# Patient Record
Sex: Female | Born: 1975 | Race: Black or African American | Hispanic: No | Marital: Single | Smoking: Never smoker
Health system: Southern US, Community
[De-identification: ages and names within clinical notes are randomized; demographics above are authoritative.]

## PROBLEM LIST (undated history)

## (undated) DIAGNOSIS — M1990 Unspecified osteoarthritis, unspecified site: Secondary | ICD-10-CM

## (undated) DIAGNOSIS — I10 Essential (primary) hypertension: Secondary | ICD-10-CM

## (undated) DIAGNOSIS — K219 Gastro-esophageal reflux disease without esophagitis: Secondary | ICD-10-CM

## (undated) DIAGNOSIS — R519 Headache, unspecified: Secondary | ICD-10-CM

## (undated) DIAGNOSIS — D649 Anemia, unspecified: Secondary | ICD-10-CM

## (undated) HISTORY — PX: KNEE ARTHROSCOPY W/ DEBRIDEMENT: SHX1867

## (undated) HISTORY — PX: TUBAL LIGATION: SHX77

---
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
## 2005-02-26 HISTORY — PX: HERNIA REPAIR: SHX51

## 2017-02-26 DIAGNOSIS — I10 Essential (primary) hypertension: Secondary | ICD-10-CM

## 2017-02-26 HISTORY — DX: Essential (primary) hypertension: I10

## 2018-05-12 NOTE — Progress Notes (Signed)
NEED ORDERS IN Epic FOR 4-1 SURGERY 

## 2018-05-28 ENCOUNTER — Encounter (HOSPITAL_COMMUNITY): Payer: Self-pay

## 2018-05-28 ENCOUNTER — Ambulatory Visit (HOSPITAL_COMMUNITY): Admit: 2018-05-28 | Payer: Self-pay | Admitting: Orthopedic Surgery

## 2018-05-28 SURGERY — ARTHROSCOPY, KNEE
Anesthesia: Choice | Laterality: Right

## 2018-10-16 NOTE — Patient Instructions (Addendum)
YOU NEED TO HAVE A COVID 19 TEST ON__Saturday 8/22_____ @_12 :10______, THIS TEST MUST BE DONE BEFORE SURGERY, COME  [address], [place] , [phone]. ONCE YOUR COVID TEST IS COMPLETED, PLEASE BEGIN THE QUARANTINE INSTRUCTIONS AS OUTLINED IN YOUR HANDOUT.                Nicole PenmanLatasha Charles   Your procedure is scheduled on: Wednesday 10/22/18   [facility]  Entrance Report to admitting at  5:30 AM   1 VISITOR IS ALLOWED TO WAIT IN WAITING ROOM  ONLY DAY OF YOUR SURGERY . NO VISITORS ARE ALLOWED IN SHORT STAY OR RECOVERY ROOM.   Call this number if you have problems the morning of surgery [phone]    . BRUSH YOUR TEETH MORNING OF SURGERY AND RINSE YOUR MOUTH OUT, NO CHEWING GUM CANDY OR MINTS.  Do not eat food After Midnight . YOU MAY HAVE CLEAR LIQUIDS FROM MIDNIGHT UNTIL 4:30AM.  At 4:30AM Please finish the prescribed Pre-Surgery  Drink.  Nothing by mouth after you finish the  drink !    Take these medicines the morning of surgery with A SIP OF WATER: none                                You may not have any metal on your body including hair pins and              piercings              Do not wear jewelry, make-up, lotions, powders or perfumes, deodorant             Do not wear nail polish.  Do not shave  48 hours prior to surgery.     Do not bring valuables to the hospital. [facility] IS NOT             RESPONSIBLE   FOR VALUABLES.  Contacts, dentures or bridgework may not be worn into surgery. .     Patients discharged the day of surgery will not be allowed to drive home.  IF YOU ARE HAVING SURGERY AND GOING HOME THE SAME DAY, YOU MUST HAVE AN ADULT TO DRIVE YOU HOME AND BE WITH YOU FOR 24 HOURS.  YOU MAY GO HOME BY TAXI OR UBER OR ORTHERWISE, BUT AN ADULT MUST ACCOMPANY YOU HOME AND STAY WITH YOU FOR 24 HOURS.  Name and phone number of your driver:  Special Instructions: N/A              Please read over the following fact sheets you  were given: _____________________________________________________________________             [facility] - Preparing for [facility] Before surgery, you can play an important role .  Because skin is not sterile, your skin needs to be as free of germs as possible .  You can reduce the number of germs on your skin by washing with CHG (chlorahexidine gluconate) soap before surgery.   CHG is an antiseptic cleaner which kills germs and bonds with the skin to continue killing germs even after washing. Please DO NOT use if you have an allergy to CHG or antibacterial soaps.   If your skin becomes reddened/irritated stop using the CHG and inform your nurse when you arrive at Short Stay. Do not shave (including legs and underarms) for at least 48 hours prior to the  first CHG shower.  . Please follow these instructions carefully:  1.  Shower with CHG Soap the night before surgery and the  morning of Surgery.  2.  If you choose to wash your hair, wash your hair first as usual with your  normal  shampoo.  3.  After you shampoo, rinse your hair and body thoroughly to remove the  shampoo.                                        4.  Use CHG as you would any other liquid soap.  You can apply chg directly  to the skin and wash                       Gently with a scrungie or clean washcloth.  5.  Apply the CHG Soap to your body ONLY FROM THE NECK DOWN.   Do not use on face/ open                           Wound or open sores. Avoid contact with eyes, ears mouth and genitals (private parts).                       Wash face,  Genitals (private parts) with your normal soap.             6.  Wash thoroughly, paying special attention to the area where your surgery  will be performed.  7.  Thoroughly rinse your body with warm water from the neck down.  8.  DO NOT shower/wash with your normal soap after using and rinsing off  the CHG Soap.             9.  Pat yourself dry with a clean towel.            10.  Wear clean  pajamas.            11.  Place clean sheets on your bed the night of your first shower and do not  sleep with pets. Day of Surgery : Do not apply any lotions/deodorants the morning of surgery.  Please wear clean clothes to the hospital/surgery center.  FAILURE TO FOLLOW THESE INSTRUCTIONS MAY RESULT IN THE CANCELLATION OF YOUR SURGERY PATIENT SIGNATURE_________________________________  NURSE SIGNATURE__________________________________  ________________________________________________________________________   Nicole Charles  An incentive spirometer is a tool that can help keep your lungs clear and active. This tool measures how well you are filling your lungs with each breath. Taking long deep breaths may help reverse or decrease the chance of developing breathing (pulmonary) problems (especially infection) following:  A long period of time when you are unable to move or be active. BEFORE THE PROCEDURE   If the spirometer includes an indicator to show your best effort, your nurse or respiratory therapist will set it to a desired goal.  If possible, sit up straight or lean slightly forward. Try not to slouch.  Hold the incentive spirometer in an upright position. INSTRUCTIONS FOR USE  1. Sit on the edge of your bed if possible, or sit up as far as you can in bed or on a chair. 2. Hold the incentive spirometer in an upright position. 3. Breathe out normally. 4. Place the mouthpiece in your mouth and seal your lips tightly around it.  5. Breathe in slowly and as deeply as possible, raising the piston or the ball toward the top of the column. 6. Hold your breath for 3-5 seconds or for as long as possible. Allow the piston or ball to fall to the bottom of the column. 7. Remove the mouthpiece from your mouth and breathe out normally. 8. Rest for a few seconds and repeat Steps 1 through 7 at least 10 times every 1-2 hours when you are awake. Take your time and take a few normal breaths  between deep breaths. 9. The spirometer may include an indicator to show your best effort. Use the indicator as a goal to work toward during each repetition. 10. After each set of 10 deep breaths, practice coughing to be sure your lungs are clear. If you have an incision (the cut made at the time of surgery), support your incision when coughing by placing a pillow or rolled up towels firmly against it. Once you are able to get out of bed, walk around indoors and cough well. You may stop using the incentive spirometer when instructed by your caregiver.  RISKS AND COMPLICATIONS  Take your time so you do not get dizzy or light-headed.  If you are in pain, you may need to take or ask for pain medication before doing incentive spirometry. It is harder to take a deep breath if you are having pain. AFTER USE  Rest and breathe slowly and easily.  It can be helpful to keep track of a log of your progress. Your caregiver can provide you with a simple table to help with this. If you are using the spirometer at home, follow these instructions: SEEK MEDICAL CARE IF:   You are having difficultly using the spirometer.  You have trouble using the spirometer as often as instructed.  Your pain medication is not giving enough relief while using the spirometer.  You develop fever of 100.5 F (38.1 C) or higher. SEEK IMMEDIATE MEDICAL CARE IF:   You cough up bloody sputum that had not been present before.  You develop fever of 102 F (38.9 C) or greater.  You develop worsening pain at or near the incision site. MAKE SURE YOU:   Understand these instructions.  Will watch your condition.  Will get help right away if you are not doing well or get worse. Document Released: 06/25/2006 Document Revised: 05/07/2011 Document Reviewed: 08/26/2006 [facility] Patient Information 2014 [employer] [place].   ________________________________________________________________________

## 2018-10-17 ENCOUNTER — Encounter (HOSPITAL_COMMUNITY)
Admission: RE | Admit: 2018-10-17 | Discharge: 2018-10-17 | Disposition: A | Discharge status: Home / Self Care | Payer: PRIVATE HEALTH INSURANCE | Source: Ambulatory Visit | Attending: Orthopedic Surgery | Admitting: Orthopedic Surgery

## 2018-10-17 ENCOUNTER — Encounter (HOSPITAL_COMMUNITY): Payer: Self-pay

## 2018-10-17 ENCOUNTER — Other Ambulatory Visit: Payer: Self-pay

## 2018-10-17 DIAGNOSIS — M949 Disorder of cartilage, unspecified: Secondary | ICD-10-CM | POA: Insufficient documentation

## 2018-10-17 DIAGNOSIS — Z01818 Encounter for other preprocedural examination: Secondary | ICD-10-CM | POA: Insufficient documentation

## 2018-10-17 DIAGNOSIS — K219 Gastro-esophageal reflux disease without esophagitis: Secondary | ICD-10-CM | POA: Diagnosis not present

## 2018-10-17 DIAGNOSIS — I10 Essential (primary) hypertension: Secondary | ICD-10-CM | POA: Diagnosis not present

## 2018-10-17 DIAGNOSIS — Z20828 Contact with and (suspected) exposure to other viral communicable diseases: Secondary | ICD-10-CM | POA: Insufficient documentation

## 2018-10-17 HISTORY — DX: Gastro-esophageal reflux disease without esophagitis: K21.9

## 2018-10-17 LAB — CBC
HCT: 39.7 % (ref 36.0–46.0)
Hemoglobin: 12.6 g/dL (ref 12.0–15.0)
MCH: 30.2 pg (ref 26.0–34.0)
MCHC: 31.7 g/dL (ref 30.0–36.0)
MCV: 95.2 fL (ref 80.0–100.0)
Platelets: 236 10*3/uL (ref 150–400)
RBC: 4.17 MIL/uL (ref 3.87–5.11)
RDW: 13.1 % (ref 11.5–15.5)
WBC: 4.1 10*3/uL (ref 4.0–10.5)
nRBC: 0 % (ref 0.0–0.2)

## 2018-10-17 LAB — BASIC METABOLIC PANEL
Anion gap: 8 (ref 5–15)
BUN: 13 mg/dL (ref 6–20)
CO2: 25 mmol/L (ref 22–32)
Calcium: 9.5 mg/dL (ref 8.9–10.3)
Chloride: 108 mmol/L (ref 98–111)
Creatinine, Ser: 0.79 mg/dL (ref 0.44–1.00)
GFR calc Af Amer: >60 mL/min (ref >60)
GFR calc non Af Amer: >60 mL/min (ref >60)
Glucose, Bld: 99 mg/dL (ref 70–99)
Potassium: 4 mmol/L (ref 3.5–5.1)
Sodium: 141 mmol/L (ref 135–145)

## 2018-10-17 NOTE — Progress Notes (Signed)
PCP - Elisabeth Pigeon NP in [place] ,[place] but no longer sees. Cardiologist - none  Chest x-ray - un known EKG - 10/17/18 Stress Test - none ECHO - none Cardiac Cath - none  Sleep Study - no CPAP -   Fasting Blood Sugar -NA Checks Blood Sugar _____ times a day  Blood Thinner Instructions:NA Aspirin Instructions: Last Dose:  Anesthesia review:   Patient denies shortness of breath, fever, cough and chest pain at PAT appointment Yes. Her BP was elevated. Taken twice, bi lat  Arms. PA notified  Patient verbalized understanding of instructions that were given to them at the PAT appointment. Patient was also instructed that they will need to review over the PAT instructions again at home before surgery. Yes

## 2018-10-18 ENCOUNTER — Other Ambulatory Visit (HOSPITAL_COMMUNITY)
Admission: RE | Admit: 2018-10-18 | Discharge: 2018-10-18 | Disposition: A | Discharge status: Home / Self Care | Payer: PRIVATE HEALTH INSURANCE | Source: Ambulatory Visit | Attending: Orthopedic Surgery | Admitting: Orthopedic Surgery

## 2018-10-18 DIAGNOSIS — Z01818 Encounter for other preprocedural examination: Secondary | ICD-10-CM | POA: Diagnosis not present

## 2018-10-18 LAB — SARS CORONAVIRUS 2 (TAT 6-24 HRS): SARS Coronavirus 2: NEGATIVE

## 2018-10-21 NOTE — Anesthesia Preprocedure Evaluation (Addendum)
Anesthesia Evaluation  Patient identified by MRN, date of birth, ID band Patient awake    Reviewed: Allergy & Precautions, NPO status , Patient's Chart, lab work & pertinent test results  History of Anesthesia Complications Negative for: history of anesthetic complications  Airway Mallampati: II  TM Distance: >3 FB Neck ROM: Full    Dental  (+) Dental Advisory Given, Teeth Intact   Pulmonary neg pulmonary ROS,    Pulmonary exam normal        Cardiovascular hypertension, Pt. on medications Normal cardiovascular exam     Neuro/Psych negative neurological ROS  negative psych ROS   GI/Hepatic Neg liver ROS, GERD  Controlled,  Endo/Other  negative endocrine ROS  Renal/GU negative Renal ROS     Musculoskeletal negative musculoskeletal ROS (+)   Abdominal   Peds  Hematology negative hematology ROS (+)   Anesthesia Other Findings   Reproductive/Obstetrics  S/p tubal ligation                             Anesthesia Physical Anesthesia Plan  ASA: III  Anesthesia Plan: General   Post-op Pain Management:    Induction: Intravenous  PONV Risk Score and Plan: 3 and Treatment may vary due to age or medical condition, Ondansetron, Dexamethasone and Midazolam  Airway Management Planned: LMA  Additional Equipment: None  Intra-op Plan:   Post-operative Plan: Extubation in OR  Informed Consent: I have reviewed the patients History and Physical, chart, labs and discussed the procedure including the risks, benefits and alternatives for the proposed anesthesia with the patient or authorized representative who has indicated his/her understanding and acceptance.     Dental advisory given  Plan Discussed with: CRNA and Anesthesiologist  Anesthesia Plan Comments:        Anesthesia Quick Evaluation

## 2018-10-21 NOTE — Progress Notes (Signed)
Anesthesia Chart Review   Case: [id number] Date/Time: 10/22/18 0715   Procedure: Right knee arthroscopy; chondroplasty (Right ) - 31min   Anesthesia type: Choice   Pre-op diagnosis: right knee chondral defect   Location: [facility] ROOM 10 / [facility]   Surgeon: Gaynelle Arabian, MD      DISCUSSION:42 y.o. never smoker with h/o HTN, GERD, right knee chondral defect scheduled for above procedure 10/22/2018 with Dr. Gaynelle Arabian.    Pt reports she is currently taking no medications for blood pressure.  Elevated at PAT, 179/107.  She is asymptomatic.  Advised to contact PCP for better BP control before proceeding with surgery.    VS: BP (!) 179/107   Pulse 67   Temp 37 C (Oral)   Ht 5\' 3"  (1.6 m)   Wt 66.2 kg   LMP 10/07/2018 (Approximate)   SpO2 100%   BMI 25.86 kg/m   PROVIDERS: Patient, No Pcp Per   LABS: Labs reviewed: Acceptable for surgery. (all labs ordered are listed, but only abnormal results are displayed)  Labs Reviewed  CBC  BASIC METABOLIC PANEL     IMAGES:   EKG: 10/17/2018 Rate 75 bpm Normal sinus rhythm   CV:  Past Medical History:  Diagnosis Date  . GERD (gastroesophageal reflux disease)   . Hypertension 2019   not on meds     Past Surgical History:  Procedure Laterality Date  . HERNIA REPAIR  2703   umbilical  . TUBAL LIGATION      MEDICATIONS: . calcium carbonate (TUMS - DOSED IN MG ELEMENTAL CALCIUM) 500 MG chewable tablet  . diphenhydramine-acetaminophen (TYLENOL PM) 25-500 MG TABS tablet  . medroxyPROGESTERone (DEPO-PROVERA) 150 MG/ML injection   No current facility-administered medications for this encounter.      Maia Plan [facility] Pre-Surgical Testing [phone] 10/21/18  10:44 AM

## 2018-10-22 ENCOUNTER — Ambulatory Visit (HOSPITAL_COMMUNITY): Payer: PRIVATE HEALTH INSURANCE | Admitting: Physician Assistant

## 2018-10-22 ENCOUNTER — Ambulatory Visit (HOSPITAL_COMMUNITY)
Admission: RE | Admit: 2018-10-22 | Discharge: 2018-10-22 | Disposition: A | Discharge status: Home / Self Care | Payer: PRIVATE HEALTH INSURANCE | Attending: Orthopedic Surgery | Admitting: Orthopedic Surgery

## 2018-10-22 ENCOUNTER — Encounter (HOSPITAL_COMMUNITY)
Admission: RE | Disposition: A | Discharge status: Home / Self Care | Payer: Self-pay | Source: Home / Self Care | Attending: Orthopedic Surgery

## 2018-10-22 ENCOUNTER — Other Ambulatory Visit: Payer: Self-pay

## 2018-10-22 ENCOUNTER — Ambulatory Visit (HOSPITAL_COMMUNITY): Payer: PRIVATE HEALTH INSURANCE | Admitting: Anesthesiology

## 2018-10-22 ENCOUNTER — Encounter (HOSPITAL_COMMUNITY): Payer: Self-pay

## 2018-10-22 DIAGNOSIS — I10 Essential (primary) hypertension: Secondary | ICD-10-CM | POA: Diagnosis not present

## 2018-10-22 DIAGNOSIS — M948X6 Other specified disorders of cartilage, lower leg: Secondary | ICD-10-CM | POA: Insufficient documentation

## 2018-10-22 DIAGNOSIS — Z79899 Other long term (current) drug therapy: Secondary | ICD-10-CM | POA: Diagnosis not present

## 2018-10-22 DIAGNOSIS — Z793 Long term (current) use of hormonal contraceptives: Secondary | ICD-10-CM | POA: Insufficient documentation

## 2018-10-22 DIAGNOSIS — M23241 Derangement of anterior horn of lateral meniscus due to old tear or injury, right knee: Secondary | ICD-10-CM | POA: Diagnosis not present

## 2018-10-22 DIAGNOSIS — M25561 Pain in right knee: Secondary | ICD-10-CM | POA: Diagnosis present

## 2018-10-22 DIAGNOSIS — K219 Gastro-esophageal reflux disease without esophagitis: Secondary | ICD-10-CM | POA: Insufficient documentation

## 2018-10-22 HISTORY — PX: KNEE ARTHROSCOPY: SHX127

## 2018-10-22 LAB — PREGNANCY, URINE: Preg Test, Ur: NEGATIVE

## 2018-10-22 SURGERY — ARTHROSCOPY, KNEE
Anesthesia: General | Site: Knee | Laterality: Right

## 2018-10-22 MED ORDER — DEXAMETHASONE SODIUM PHOSPHATE 10 MG/ML IJ SOLN
INTRAMUSCULAR | Status: DC | PRN
  Administered 2018-10-22: 10 mg via INTRAVENOUS

## 2018-10-22 MED ORDER — DEXAMETHASONE SODIUM PHOSPHATE 10 MG/ML IJ SOLN
INTRAMUSCULAR | Status: AC
  Filled 2018-10-22: qty 1

## 2018-10-22 MED ORDER — 0.9 % SODIUM CHLORIDE (POUR BTL) OPTIME
TOPICAL | Status: DC | PRN
  Administered 2018-10-22: 08:00:00 1000 mL

## 2018-10-22 MED ORDER — ONDANSETRON HCL 4 MG/2ML IJ SOLN
INTRAMUSCULAR | Status: DC | PRN
  Administered 2018-10-22: 4 mg via INTRAVENOUS

## 2018-10-22 MED ORDER — LACTATED RINGERS IV SOLN
INTRAVENOUS | Status: DC
  Administered 2018-10-22: 07:00:00 via INTRAVENOUS

## 2018-10-22 MED ORDER — LIDOCAINE 2% (20 MG/ML) 5 ML SYRINGE
INTRAMUSCULAR | Status: DC | PRN
  Administered 2018-10-22: 80 mg via INTRAVENOUS

## 2018-10-22 MED ORDER — PROMETHAZINE HCL 25 MG/ML IJ SOLN: 6.2500 mg | INTRAMUSCULAR | Status: DC | PRN

## 2018-10-22 MED ORDER — BUPIVACAINE-EPINEPHRINE 0.25% -1:200000 IJ SOLN
INTRAMUSCULAR | Status: DC | PRN
  Administered 2018-10-22: 20 mL

## 2018-10-22 MED ORDER — METHOCARBAMOL 500 MG PO TABS: 500.0000 mg | ORAL_TABLET | Freq: Three times a day (TID) | ORAL | 1 refills | Status: DC | PRN

## 2018-10-22 MED ORDER — ONDANSETRON HCL 4 MG/2ML IJ SOLN
INTRAMUSCULAR | Status: AC
  Filled 2018-10-22: qty 2

## 2018-10-22 MED ORDER — OXYCODONE HCL 5 MG PO TABS: 5.0000 mg | ORAL_TABLET | Freq: Once | ORAL | Status: DC | PRN

## 2018-10-22 MED ORDER — LIDOCAINE 2% (20 MG/ML) 5 ML SYRINGE
INTRAMUSCULAR | Status: AC
  Filled 2018-10-22: qty 5

## 2018-10-22 MED ORDER — OXYCODONE HCL 5 MG/5ML PO SOLN: 5.0000 mg | Freq: Once | ORAL | Status: DC | PRN

## 2018-10-22 MED ORDER — MIDAZOLAM HCL 2 MG/2ML IJ SOLN
INTRAMUSCULAR | Status: AC
  Filled 2018-10-22: qty 2

## 2018-10-22 MED ORDER — HYDROCODONE-ACETAMINOPHEN 5-325 MG PO TABS: 1.0000 | ORAL_TABLET | Freq: Four times a day (QID) | ORAL | 0 refills | Status: DC | PRN

## 2018-10-22 MED ORDER — FENTANYL CITRATE (PF) 100 MCG/2ML IJ SOLN
25.0000 ug | INTRAMUSCULAR | Status: DC | PRN
  Administered 2018-10-22 (×2): 50 ug via INTRAVENOUS

## 2018-10-22 MED ORDER — FENTANYL CITRATE (PF) 100 MCG/2ML IJ SOLN
INTRAMUSCULAR | Status: AC
  Administered 2018-10-22: 50 ug via INTRAVENOUS
  Filled 2018-10-22: qty 2

## 2018-10-22 MED ORDER — POVIDONE-IODINE 10 % EX SWAB
2.0000 "application " | Freq: Once | CUTANEOUS | Status: AC
  Administered 2018-10-22: 2 via TOPICAL

## 2018-10-22 MED ORDER — CHLORHEXIDINE GLUCONATE 4 % EX LIQD: 60.0000 mL | Freq: Once | CUTANEOUS | Status: DC

## 2018-10-22 MED ORDER — PROPOFOL 10 MG/ML IV BOLUS
INTRAVENOUS | Status: DC | PRN
  Administered 2018-10-22: 200 mg via INTRAVENOUS

## 2018-10-22 MED ORDER — LACTATED RINGERS IR SOLN
Status: DC | PRN
  Administered 2018-10-22: 9000 mL

## 2018-10-22 MED ORDER — FENTANYL CITRATE (PF) 100 MCG/2ML IJ SOLN
INTRAMUSCULAR | Status: AC
  Filled 2018-10-22: qty 2

## 2018-10-22 MED ORDER — KETOROLAC TROMETHAMINE 30 MG/ML IJ SOLN
INTRAMUSCULAR | Status: DC | PRN
  Administered 2018-10-22: 30 mg via INTRAVENOUS

## 2018-10-22 MED ORDER — PROPOFOL 10 MG/ML IV BOLUS
INTRAVENOUS | Status: AC
  Filled 2018-10-22: qty 20

## 2018-10-22 MED ORDER — MIDAZOLAM HCL 5 MG/5ML IJ SOLN
INTRAMUSCULAR | Status: DC | PRN
  Administered 2018-10-22: 2 mg via INTRAVENOUS

## 2018-10-22 MED ORDER — KETOROLAC TROMETHAMINE 30 MG/ML IJ SOLN
INTRAMUSCULAR | Status: AC
  Filled 2018-10-22: qty 1

## 2018-10-22 MED ORDER — CEFAZOLIN SODIUM-DEXTROSE 2-4 GM/100ML-% IV SOLN
2.0000 g | INTRAVENOUS | Status: AC
  Administered 2018-10-22: 07:00:00 2 g via INTRAVENOUS
  Filled 2018-10-22: qty 100

## 2018-10-22 MED ORDER — FENTANYL CITRATE (PF) 100 MCG/2ML IJ SOLN
INTRAMUSCULAR | Status: DC | PRN
  Administered 2018-10-22 (×4): 25 ug via INTRAVENOUS

## 2018-10-22 MED ORDER — BUPIVACAINE-EPINEPHRINE (PF) 0.25% -1:200000 IJ SOLN
INTRAMUSCULAR | Status: AC
  Filled 2018-10-22: qty 30

## 2018-10-22 SURGICAL SUPPLY — 34 items
BLADE SURG 15 STRL LF DISP TIS (BLADE) ×1 IMPLANT
BLADE SURG 15 STRL SS (BLADE) ×1
BNDG ELASTIC 6X5.8 VLCR STR LF (GAUZE/BANDAGES/DRESSINGS) ×2 IMPLANT
COVER WAND RF STERILE (DRAPES) IMPLANT
DECANTER SPIKE VIAL GLASS SM (MISCELLANEOUS) ×2 IMPLANT
DISSECTOR 4.0MM X 13CM (MISCELLANEOUS) ×2 IMPLANT
DRAPE ARTHROSCOPY W/POUCH 114 (DRAPES) ×2 IMPLANT
DRAPE SHEET LG 3/4 BI-LAMINATE (DRAPES) ×2 IMPLANT
DRAPE U-SHAPE 47X51 STRL (DRAPES) ×2 IMPLANT
DRSG EMULSION OIL 3X3 NADH (GAUZE/BANDAGES/DRESSINGS) ×2 IMPLANT
DRSG PAD ABDOMINAL 8X10 ST (GAUZE/BANDAGES/DRESSINGS) ×2 IMPLANT
DURAPREP 26ML APPLICATOR (WOUND CARE) ×2 IMPLANT
GAUZE SPONGE 4X4 12PLY STRL (GAUZE/BANDAGES/DRESSINGS) ×2 IMPLANT
GLOVE BIO SURGEON STRL SZ8 (GLOVE) ×2 IMPLANT
GLOVE BIOGEL PI IND STRL 7.0 (GLOVE) ×1 IMPLANT
GLOVE BIOGEL PI IND STRL 8 (GLOVE) ×1 IMPLANT
GLOVE BIOGEL PI INDICATOR 7.0 (GLOVE) ×1
GLOVE BIOGEL PI INDICATOR 8 (GLOVE) ×1
GLOVE SURG SS PI 7.0 STRL IVOR (GLOVE) ×2 IMPLANT
GOWN STRL REUS W/TWL LRG LVL3 (GOWN DISPOSABLE) ×4 IMPLANT
KIT BASIN OR (CUSTOM PROCEDURE TRAY) ×2 IMPLANT
KIT TURNOVER KIT A (KITS) IMPLANT
MANIFOLD NEPTUNE II (INSTRUMENTS) ×2 IMPLANT
MARKER SKIN DUAL TIP RULER LAB (MISCELLANEOUS) ×2 IMPLANT
PACK ARTHROSCOPY WL (CUSTOM PROCEDURE TRAY) ×2 IMPLANT
PACK ICE MAXI GEL EZY WRAP (MISCELLANEOUS) ×6 IMPLANT
PADDING CAST COTTON 6X4 STRL (CAST SUPPLIES) ×2 IMPLANT
PORT APPOLLO RF 90DEGREE MULTI (SURGICAL WAND) ×2 IMPLANT
PROTECTOR NERVE ULNAR (MISCELLANEOUS) ×2 IMPLANT
SUT ETHILON 4 0 PS 2 18 (SUTURE) ×4 IMPLANT
TOWEL OR 17X26 10 PK STRL BLUE (TOWEL DISPOSABLE) ×2 IMPLANT
TUBING ARTHRO INFLOW-ONLY STRL (TUBING) ×2 IMPLANT
TUBING CONNECTING 10 (TUBING) ×4 IMPLANT
WRAP KNEE MAXI GEL POST OP (GAUZE/BANDAGES/DRESSINGS) ×2 IMPLANT

## 2018-10-22 NOTE — Transfer of Care (Signed)
Immediate Anesthesia Transfer of Care Note  Patient: Felice Hope  Procedure(s) Performed: Right knee arthroscopy; chondroplasty, Meniscal Debridemnt (Right Knee)  Patient Location: PACU  Anesthesia Type:General  Level of Consciousness: drowsy  Airway & Oxygen Therapy: Patient Spontanous Breathing and Patient connected to face mask oxygen  Post-op Assessment: Report given to RN and Post -op Vital signs reviewed and stable  Post vital signs: Reviewed and stable  Last Vitals:  Vitals Value Taken Time  BP 132/64 10/22/18 0827  Temp 36.7 C 10/22/18 0827  Pulse 73 10/22/18 0828  Resp 16 10/22/18 0828  SpO2 100 % 10/22/18 0828  Vitals shown include unvalidated device data.  Last Pain:  Vitals:   10/22/18 0543  TempSrc: Oral  PainSc:       Patients Stated Pain Goal: 3 (37/04/88 8916)  Complications: No apparent anesthesia complications

## 2018-10-22 NOTE — Anesthesia Postprocedure Evaluation (Signed)
Anesthesia Post Note  Patient: Nicole Charles  Procedure(s) Performed: Right knee arthroscopy; chondroplasty, Meniscal Debridemnt (Right Knee)     Patient location during evaluation: PACU Anesthesia Type: General Level of consciousness: awake and alert Pain management: pain level controlled Vital Signs Assessment: post-procedure vital signs reviewed and stable Respiratory status: spontaneous breathing, nonlabored ventilation and respiratory function stable Cardiovascular status: blood pressure returned to baseline and stable Postop Assessment: no apparent nausea or vomiting Anesthetic complications: no    Last Vitals:  Vitals:   10/22/18 0851 10/22/18 0900  BP:  129/84  Pulse:  70  Resp:  14  Temp:    SpO2: 97% 95%                  Audry Pili

## 2018-10-22 NOTE — Op Note (Signed)
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 Bowman Higbie, MD  Anesthesia-General  EBL-  Minimal  Complications- None  Condition- PACU - hemodynamically stable.  Brief clinical note- -Nicole Charles is a 43 y.o.  female with a several year history of Right pain and mechanical symptoms. Exam and history suggested patellar chondral defect The patient presents now for arthroscopy and debridement  Procedure in detail -       After successful administration of General anesthetic,  the Right lower extremity is prepped and draped in the usual sterile fashion. Time out is performed by the surgical team. Standard superomedial and inferolateral portal sites are marked and incisions made with an 11 blade. The inflow cannula is passed through the superomedial portal and camera through the inferolateral portal and inflow is initiated. Arthroscopic visualization proceeds.      The undersurface of the patella and trochlea are visualized and there is a chondral defect inferior pole patella as well as hypertrophic scarring of the fat pad impinging on the patellofemoral joint. The medial and lateral gutters are visualized and there are no loose bodies. Flexion and valgus force is applied to the knee and the medial compartment is entered. A spinal needle is passed into the joint through the site marked for the inferomedial portal. A small incision is made and the dilator passed into the joint. The findings for the medial compartment are normal .   The intercondylar notch is visualized and the ACL appears normal . The lateral compartment is entered and the findings are tear of body and anterior horn lateral meniscus . The tear is debrided to a stable base with  baskets and a shaver and sealed off with the Arthrocare. The patellofemoral joint is then addressed. The shaver is used to debride the unstable cartilage to a stable cartilaginous base with stable edges. It is probed and found to be stable. The hypertrophic portion of the fat pad is debrided back to normal tissue to eliminate the impingement.     The joint is again inspected and there are no other tears, defects or loose bodies identified. The arthroscopic equipment is then removed from the inferior portals which are closed with interrupted 4-0 nylon. 20 ml of .25% Marcaine with epinephrine are injected through the inflow cannula and the cannula is then removed and the portal closed with nylon. The incisions are cleaned and dried and a bulky sterile dressing is applied. The patient is then awakened and transported to recovery in stable condition.   10/22/2018, 8:18 AM

## 2018-10-22 NOTE — H&P (Signed)
CC- Nicole Charles is a 43 y.o. female who presents with right knee pain.  HPI- . Knee Pain: Patient presents with knee pain involving the  right knee. Onset of the symptoms was several years ago. Inciting event: none known. Current symptoms include crepitus sensation, giving out, pain located anteriorly, stiffness and swelling. Pain is aggravated by going up and down stairs, pivoting, rising after sitting, standing and walking.  Patient has had prior knee problems. Evaluation to date: previous arthroscopy with chondral defects patella. Treatment to date: corticosteroid injection which was not very effective and viscosupplement injections which were not very effective.  Past Medical History:  Diagnosis Date  . GERD (gastroesophageal reflux disease)   . Hypertension 2019   not on meds     Past Surgical History:  Procedure Laterality Date  . HERNIA REPAIR  3235   umbilical  . TUBAL LIGATION      Prior to Admission medications   Medication Sig Start Date End Date Taking? Authorizing Provider  calcium carbonate (TUMS - DOSED IN MG ELEMENTAL CALCIUM) 500 MG chewable tablet Chew 1 tablet by mouth daily as needed for indigestion or heartburn.   Yes [provider]  diphenhydramine-acetaminophen (TYLENOL PM) 25-500 MG TABS tablet Take 1 tablet by mouth at bedtime as needed (sleep).   Yes [provider]  lisinopril (ZESTRIL) 5 MG tablet Take 5 mg by mouth daily. Just started taking this week   Yes [provider]  medroxyPROGESTERone (DEPO-PROVERA) 150 MG/ML injection Inject 150 mg into the muscle every 3 (three) months.   Yes [provider]   KNEE EXAM antalgic gait, effusion, negative drawer sign, collateral ligaments intact, negative Lachman sign, significant crepitus on range of motion  Physical Examination: General appearance - alert, well appearing, and in no distress Mental status - alert, oriented to person, place, and time Chest - clear to  auscultation, no wheezes, rales or rhonchi, symmetric air entry Heart - normal rate, regular rhythm, normal S1, S2, no murmurs, rubs, clicks or gallops Abdomen - soft, nontender, nondistended, no masses or organomegaly Neurological - alert, oriented, normal speech, no focal findings or movement disorder noted    Asessment/Plan--- Right knee chondral defect- - Plan right knee arthroscopy with chondroplasty. Procedure risks and potential comps discussed with patient who elects to proceed. Goals are decreased pain and increased function with a high likelihood of achieving both

## 2018-10-22 NOTE — Discharge Instructions (Signed)
Dr. Gaynelle Arabian Total Joint Specialist [facility] [address], [place], [place] [address] [phone]   Arthroscopic Procedure, Knee An arthroscopic procedure can find what is wrong with your knee. PROCEDURE Arthroscopy is a surgical technique that allows your orthopedic surgeon to diagnose and treat your knee injury with accuracy. They will look into your knee through a small instrument. This is almost like a small (pencil sized) telescope. Because arthroscopy affects your knee less than open knee surgery, you can anticipate a more rapid recovery. Taking an active role by following your caregiver's instructions will help with rapid and complete recovery. Use crutches, rest, elevation, ice, and knee exercises as instructed. The length of recovery depends on various factors including type of injury, age, physical condition, medical conditions, and your rehabilitation. Your knee is the joint between the large bones (femur and tibia) in your leg. Cartilage covers these bone ends which are smooth and slippery and allow your knee to bend and move smoothly. Two menisci, thick, semi-lunar shaped pads of cartilage which form a rim inside the joint, help absorb shock and stabilize your knee. Ligaments bind the bones together and support your knee joint. Muscles move the joint, help support your knee, and take stress off the joint itself. Because of this all programs and physical therapy to rehabilitate an injured or repaired knee require rebuilding and strengthening your muscles. AFTER THE PROCEDURE  After the procedure, you will be moved to a recovery area until most of the effects of the medication have worn off. Your caregiver will discuss the test results with you.   Only take over-the-counter or prescription medicines for pain, discomfort, or fever as directed by your caregiver.  SEEK MEDICAL CARE IF:   You have increased bleeding from your wounds.   You see redness,  swelling, or have increasing pain in your wounds.   You have pus coming from your wound.   You have an oral temperature above 102 F (38.9 C).   You notice a bad smell coming from the wound or dressing.   You have severe pain with any motion of your knee.  SEEK IMMEDIATE MEDICAL CARE IF:   You develop a rash.   You have difficulty breathing.   You have any allergic problems.  FURTHER INSTRUCTIONS:   ICE to the affected knee every three hours for 30 minutes at a time and then as needed for pain and swelling.  Continue to use ice on the knee for pain and swelling from surgery. You may notice swelling that will progress down to the foot and ankle.  This is normal after surgery.  Elevate the leg when you are not up walking on it.    DIET You may resume your previous home diet once your are discharged from the hospital.  DRESSING / WOUND CARE / SHOWERING You may start showering two days after being discharged home but do not submerge the incisions under water.  Change dressing 48 hours after the procedure and then cover the small incisions with band aids until your follow up visit. Change the surgical dressings daily and reapply a dry dressing each time.   ACTIVITY Walk with your walker as instructed. Use walker as long as suggested by your caregivers. Avoid periods of inactivity such as sitting longer than an hour when not asleep. This helps prevent blood clots.  You may resume a sexual relationship in one month or when given the OK by your doctor.  You may return to  work once you are cleared by your doctor.  Do not drive a car for 6 weeks or until released by you surgeon.  Do not drive while taking narcotics.  WEIGHT BEARING Weight bearing as tolerated with crutches or walker until you are comfortable without them  POSTOPERATIVE CONSTIPATION PROTOCOL Constipation - defined medically as fewer than three stools per week and severe constipation as less than one stool per  week.  One of the most common issues patients have following surgery is constipation.  Even if you have a regular bowel pattern at home, your normal regimen is likely to be disrupted due to multiple reasons following surgery.  Combination of anesthesia, postoperative narcotics, change in appetite and fluid intake all can affect your bowels.  In order to avoid complications following surgery, here are some recommendations in order to help you during your recovery period.  Colace (docusate) - Pick up an over-the-counter form of Colace or another stool softener and take twice a day as long as you are requiring postoperative pain medications.  Take with a full glass of water daily.  If you experience loose stools or diarrhea, hold the colace until you stool forms back up.  If your symptoms do not get better within 1 week or if they get worse, check with your doctor.  Dulcolax (bisacodyl) - Pick up over-the-counter and take as directed by the product packaging as needed to assist with the movement of your bowels.  Take with a full glass of water.  Use this product as needed if not relieved by Colace only.   MiraLax (polyethylene glycol) - Pick up over-the-counter to have on hand.  MiraLax is a solution that will increase the amount of water in your bowels to assist with bowel movements.  Take as directed and can mix with a glass of water, juice, soda, coffee, or tea.  Take if you go more than two days without a movement. Do not use MiraLax more than once per day. Call your doctor if you are still constipated or irregular after using this medication for 7 days in a row.  If you continue to have problems with postoperative constipation, please contact the office for further assistance and recommendations.  If you experience "the worst abdominal pain ever" or develop nausea or vomiting, please contact the office immediatly for further recommendations for treatment.  ITCHING  If you experience itching with your  medications, try taking only a single pain pill, or even half a pain pill at a time.  You can also use Benadryl over the counter for itching or also to help with sleep.   TED HOSE STOCKINGS Wear the elastic stockings on both legs for three weeks following surgery during the day but you may remove then at night for sleeping.  MEDICATIONS See your medication summary on the After Visit Summary that the nursing staff will review with you prior to discharge.  You may have some home medications which will be placed on hold until you complete the course of blood thinner medication.  It is important for you to complete the blood thinner medication as prescribed by your surgeon.  Continue your approved medications as instructed at time of discharge. Do not drive while taking narcotics.   PRECAUTIONS If you experience chest pain or shortness of breath - call 911 immediately for transfer to the hospital emergency department.  If you develop a fever greater that 101 F, purulent drainage from wound, increased redness or drainage from wound, foul odor from  the wound/dressing, or calf pain - CONTACT YOUR SURGEON.                                                   FOLLOW-UP APPOINTMENTS Make sure you keep all of your appointments after your operation with your surgeon and caregivers. You should call the office at [phone]  and make an appointment for approximately one week after the date of your surgery or on the date instructed by your surgeon outlined in the "After Visit Summary".  RANGE OF MOTION AND STRENGTHENING EXERCISES  Rehabilitation of the knee is important following a knee injury or an operation. After just a few days of immobilization, the muscles of the thigh which control the knee become weakened and shrink (atrophy). Knee exercises are designed to build up the tone and strength of the thigh muscles and to improve knee motion. Often times heat used for twenty to thirty minutes before working  out will loosen up your tissues and help with improving the range of motion but do not use heat for the first two weeks following surgery. These exercises can be done on a training (exercise) mat, on the floor, on a table or on a bed. Use what ever works the best and is most comfortable for you Knee exercises include:  Take an 81 mg Aspirin daily for the next 4 weeks starting tomorrow  You may remove your large bandage and shower in 2 days  QUAD STRENGTHENING EXERCISES Strengthening Quadriceps Sets  Tighten muscles on top of thigh by pushing knees down into floor or table. Hold for 20 seconds. Repeat 10 times. Do 2 sessions per day.     Strengthening Terminal Knee Extension  With knee bent over bolster, straighten knee by tightening muscle on top of thigh. Be sure to keep bottom of knee on bolster. Hold for 20 seconds. Repeat 10 times. Do 2 sessions per day.   Straight Leg with Bent Knee  Lie on back with opposite leg bent. Keep involved knee slightly bent at knee and raise leg 4-6". Hold for 10 seconds. Repeat 20 times per set. Do 2 sets per session. Do 2 sessions per day.

## 2018-10-22 NOTE — Anesthesia Procedure Notes (Signed)
Procedure Name: LMA Insertion Date/Time: 10/22/2018 7:13 AM Performed by: Audry Pili, MD Patient Re-evaluated:Patient Re-evaluated prior to induction Oxygen Delivery Method: Circle system utilized Preoxygenation: Pre-oxygenation with 100% oxygen Induction Type: IV induction Ventilation: Mask ventilation without difficulty LMA: LMA inserted LMA Size: 4.0 Number of attempts: 1 Placement Confirmation: positive ETCO2 and breath sounds checked- equal and bilateral Dental Injury: Teeth and Oropharynx as per pre-operative assessment

## 2018-10-23 ENCOUNTER — Encounter (HOSPITAL_COMMUNITY): Payer: Self-pay | Admitting: Orthopedic Surgery

## 2020-05-19 ENCOUNTER — Other Ambulatory Visit: Payer: Self-pay | Admitting: Gastroenterology

## 2020-05-19 ENCOUNTER — Ambulatory Visit
Admission: RE | Admit: 2020-05-19 | Discharge: 2020-05-19 | Disposition: A | Discharge status: Home / Self Care | Payer: PRIVATE HEALTH INSURANCE | Source: Ambulatory Visit | Attending: Gastroenterology | Admitting: Gastroenterology

## 2020-05-19 DIAGNOSIS — R109 Unspecified abdominal pain: Secondary | ICD-10-CM

## 2020-05-24 ENCOUNTER — Other Ambulatory Visit: Payer: Self-pay | Admitting: Gastroenterology

## 2020-05-24 DIAGNOSIS — R9389 Abnormal findings on diagnostic imaging of other specified body structures: Secondary | ICD-10-CM

## 2020-05-24 DIAGNOSIS — N201 Calculus of ureter: Secondary | ICD-10-CM

## 2020-05-25 ENCOUNTER — Ambulatory Visit
Admission: RE | Admit: 2020-05-25 | Discharge: 2020-05-25 | Disposition: A | Discharge status: Home / Self Care | Payer: 59 | Source: Ambulatory Visit | Attending: Gastroenterology | Admitting: Gastroenterology

## 2020-05-25 DIAGNOSIS — N201 Calculus of ureter: Secondary | ICD-10-CM

## 2020-05-25 DIAGNOSIS — R9389 Abnormal findings on diagnostic imaging of other specified body structures: Secondary | ICD-10-CM

## 2020-07-14 ENCOUNTER — Other Ambulatory Visit (HOSPITAL_COMMUNITY): Payer: Self-pay | Admitting: Internal Medicine

## 2020-07-28 ENCOUNTER — Other Ambulatory Visit (HOSPITAL_COMMUNITY): Payer: Self-pay | Admitting: Internal Medicine

## 2020-07-28 DIAGNOSIS — R0789 Other chest pain: Secondary | ICD-10-CM

## 2020-08-17 ENCOUNTER — Telehealth (HOSPITAL_COMMUNITY): Payer: Self-pay | Admitting: *Deleted

## 2020-08-17 NOTE — Telephone Encounter (Signed)
Reaching out to patient to offer assistance regarding upcoming cardiac imaging study; pt verbalizes understanding of appt date/time, parking situation and where to check in, pre-test NPO status, and verified current allergies; name and call back number provided for further questions should they arise  Kendallyn Lippold RN Navigator Cardiac Imaging [facility] [phone] office [phone] cell  

## 2020-08-18 ENCOUNTER — Telehealth (HOSPITAL_COMMUNITY): Payer: Self-pay | Admitting: *Deleted

## 2020-08-18 ENCOUNTER — Encounter (HOSPITAL_COMMUNITY): Payer: Self-pay

## 2020-08-18 ENCOUNTER — Ambulatory Visit (HOSPITAL_COMMUNITY)
Admission: RE | Admit: 2020-08-18 | Discharge: 2020-08-18 | Disposition: A | Discharge status: Home / Self Care | Payer: 59 | Source: Ambulatory Visit | Attending: Internal Medicine | Admitting: Internal Medicine

## 2020-08-18 NOTE — Telephone Encounter (Signed)
Patient's CCTA has now been approved. Called patient to attempt to re-schedule CCTA. Left message on voicemail with name and callback number  Larey Brick RN Navigator Cardiac Imaging [facility] [phone] Office [phone] Cell

## 2020-08-31 ENCOUNTER — Telehealth (HOSPITAL_COMMUNITY): Payer: Self-pay | Admitting: *Deleted

## 2020-08-31 ENCOUNTER — Telehealth (HOSPITAL_COMMUNITY): Payer: Self-pay | Admitting: Emergency Medicine

## 2020-08-31 NOTE — Telephone Encounter (Signed)
Pt returning phone call regarding upcoming cardiac imaging study; pt verbalizes understanding of appt date/time, parking situation and where to check in, pre-test NPO status and medications ordered, and verified current allergies; name and call back number provided for further questions should they arise Nicole Alexandria RN Navigator Cardiac Imaging [facility] [phone] office [phone] cell  Daily meds (IV beta blockers if needed) Denies iv issues Denies claustro

## 2020-08-31 NOTE — Telephone Encounter (Signed)
Attempted to call patient regarding upcoming cardiac CT appointment. °Left message on voicemail with name and callback number ° °Kandy Towery RN Navigator Cardiac Imaging °[facility] °[phone] Office °[phone] Cell ° °

## 2020-09-01 ENCOUNTER — Other Ambulatory Visit: Payer: Self-pay

## 2020-09-01 ENCOUNTER — Ambulatory Visit (HOSPITAL_COMMUNITY)
Admission: RE | Admit: 2020-09-01 | Discharge: 2020-09-01 | Disposition: A | Discharge status: Home / Self Care | Payer: 59 | Source: Ambulatory Visit | Attending: Internal Medicine | Admitting: Internal Medicine

## 2020-09-01 ENCOUNTER — Encounter (HOSPITAL_COMMUNITY): Payer: Self-pay

## 2020-09-01 DIAGNOSIS — R0789 Other chest pain: Secondary | ICD-10-CM | POA: Diagnosis not present

## 2020-09-01 MED ORDER — METOPROLOL TARTRATE 5 MG/5ML IV SOLN
10.0000 mg | INTRAVENOUS | Status: DC | PRN
  Administered 2020-09-01: 10 mg via INTRAVENOUS

## 2020-09-01 MED ORDER — NITROGLYCERIN 0.4 MG SL SUBL
SUBLINGUAL_TABLET | SUBLINGUAL | Status: AC
  Administered 2020-09-01: 0.8 mg via SUBLINGUAL
  Filled 2020-09-01: qty 2

## 2020-09-01 MED ORDER — NITROGLYCERIN 0.4 MG SL SUBL: 0.8000 mg | SUBLINGUAL_TABLET | Freq: Once | SUBLINGUAL | Status: AC

## 2020-09-01 MED ORDER — METOPROLOL TARTRATE 5 MG/5ML IV SOLN
INTRAVENOUS | Status: AC
  Filled 2020-09-01: qty 20

## 2020-09-01 MED ORDER — IOHEXOL 350 MG/ML SOLN
95.0000 mL | Freq: Once | INTRAVENOUS | Status: AC | PRN
  Administered 2020-09-01: 95 mL via INTRAVENOUS

## 2020-09-07 ENCOUNTER — Other Ambulatory Visit (HOSPITAL_COMMUNITY): Payer: Self-pay | Admitting: Nurse Practitioner

## 2020-09-07 DIAGNOSIS — M79604 Pain in right leg: Secondary | ICD-10-CM

## 2020-09-22 ENCOUNTER — Encounter (HOSPITAL_COMMUNITY): Payer: Self-pay

## 2020-09-22 ENCOUNTER — Ambulatory Visit (HOSPITAL_COMMUNITY): Payer: 59

## 2021-03-28 ENCOUNTER — Other Ambulatory Visit (HOSPITAL_COMMUNITY): Payer: Self-pay | Admitting: Nurse Practitioner

## 2021-03-28 DIAGNOSIS — R911 Solitary pulmonary nodule: Secondary | ICD-10-CM

## 2021-03-28 DIAGNOSIS — Z1231 Encounter for screening mammogram for malignant neoplasm of breast: Secondary | ICD-10-CM

## 2021-04-13 ENCOUNTER — Other Ambulatory Visit: Payer: Self-pay

## 2021-04-13 ENCOUNTER — Ambulatory Visit (HOSPITAL_COMMUNITY)
Admission: RE | Admit: 2021-04-13 | Discharge: 2021-04-13 | Disposition: A | Discharge status: Home / Self Care | Payer: 59 | Source: Ambulatory Visit | Attending: Nurse Practitioner | Admitting: Nurse Practitioner

## 2021-04-13 DIAGNOSIS — Z1231 Encounter for screening mammogram for malignant neoplasm of breast: Secondary | ICD-10-CM | POA: Diagnosis not present

## 2021-08-01 ENCOUNTER — Other Ambulatory Visit: Payer: Self-pay | Admitting: Nurse Practitioner

## 2021-08-01 ENCOUNTER — Other Ambulatory Visit (HOSPITAL_COMMUNITY): Payer: Self-pay | Admitting: Nurse Practitioner

## 2021-08-01 DIAGNOSIS — M519 Unspecified thoracic, thoracolumbar and lumbosacral intervertebral disc disorder: Secondary | ICD-10-CM

## 2021-08-17 ENCOUNTER — Ambulatory Visit (HOSPITAL_COMMUNITY): Payer: 59

## 2021-08-17 ENCOUNTER — Encounter (HOSPITAL_COMMUNITY): Payer: Self-pay

## 2022-02-09 IMAGING — DX DG ABDOMEN 2V
2 series · 2 of 2 positions shown · non-contrast
Comparison: None.

CLINICAL DATA: Lower abdominal pain.

EXAM:
ABDOMEN - 2 VIEW

[dg abd 2 views (1 of 2)]
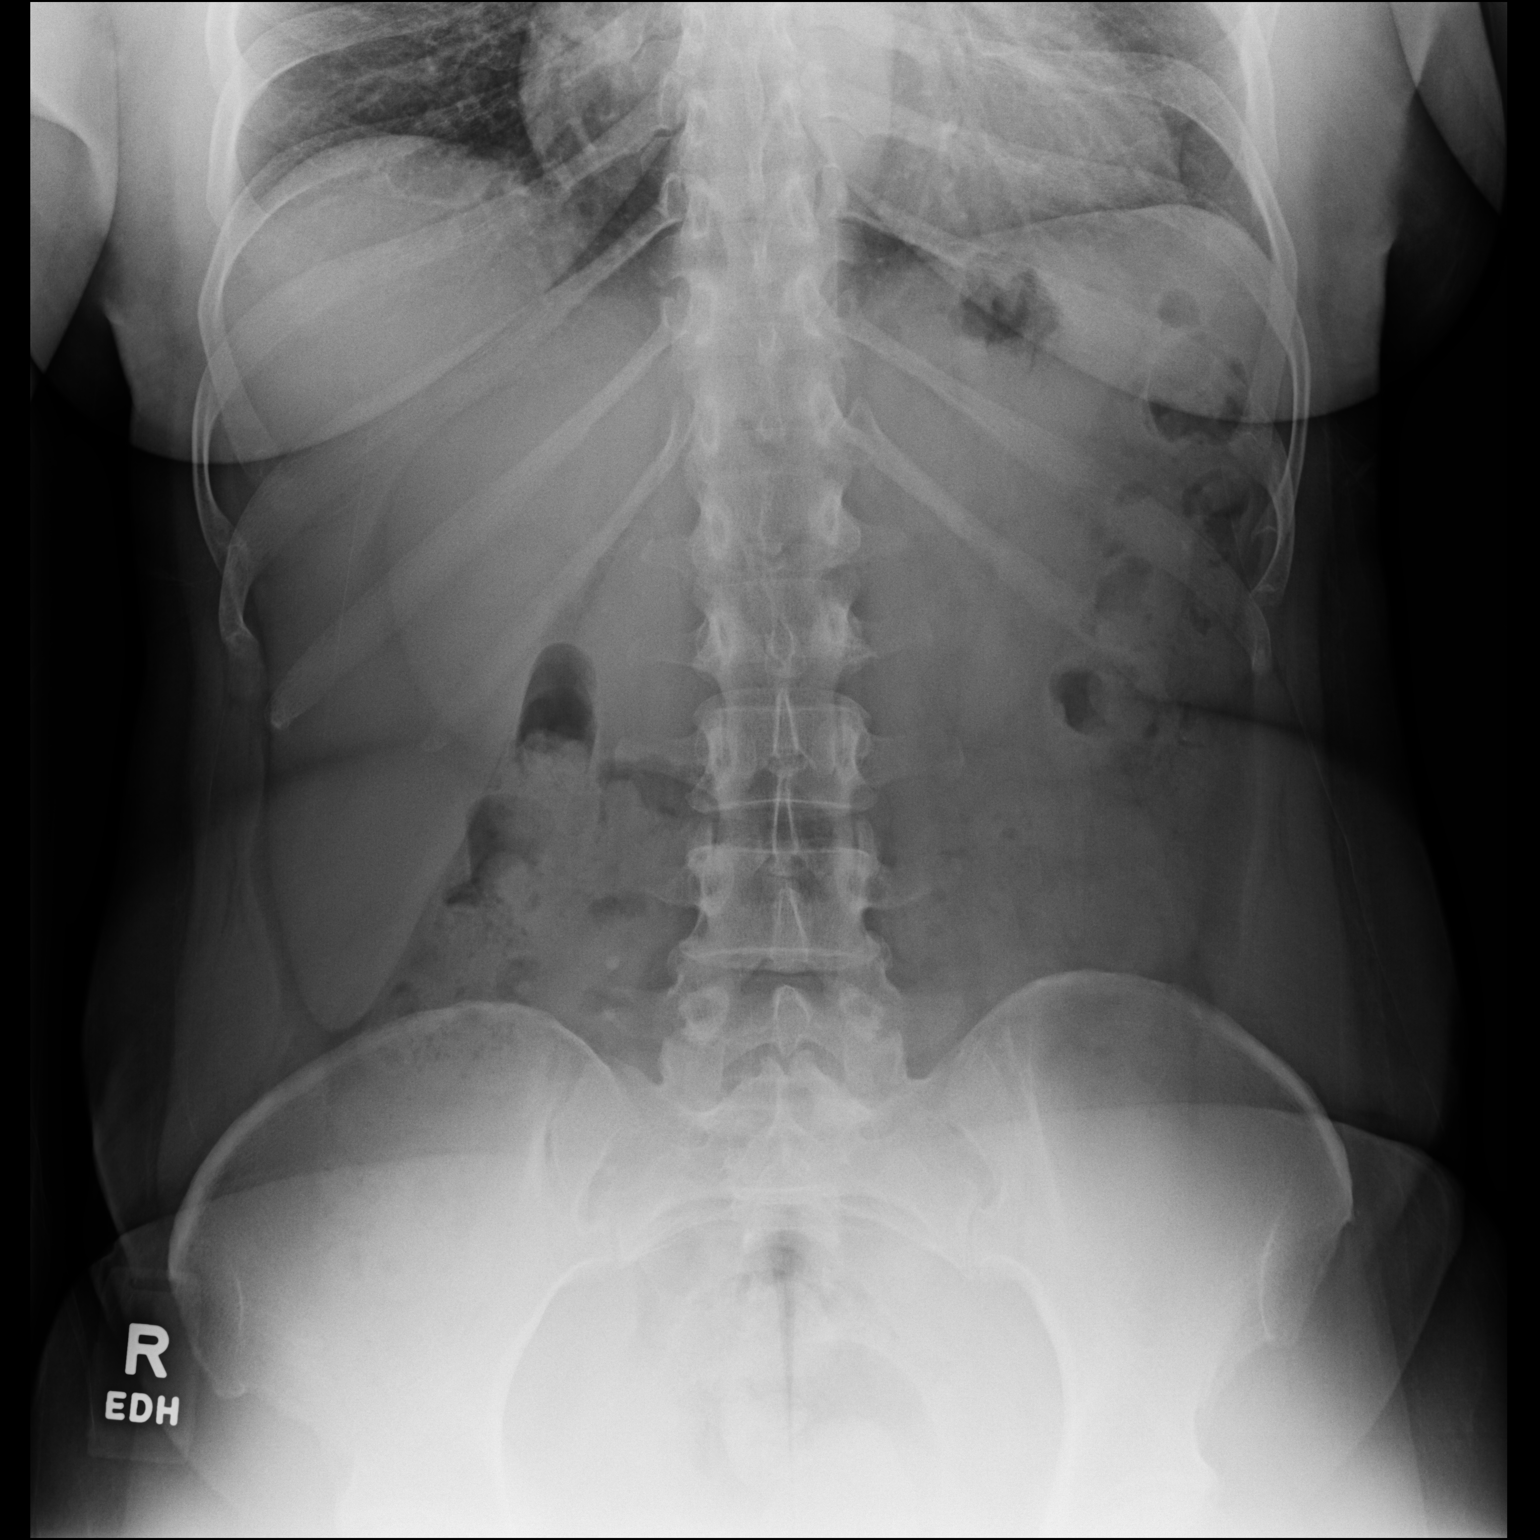

[dg abd 2 views (2 of 2)]
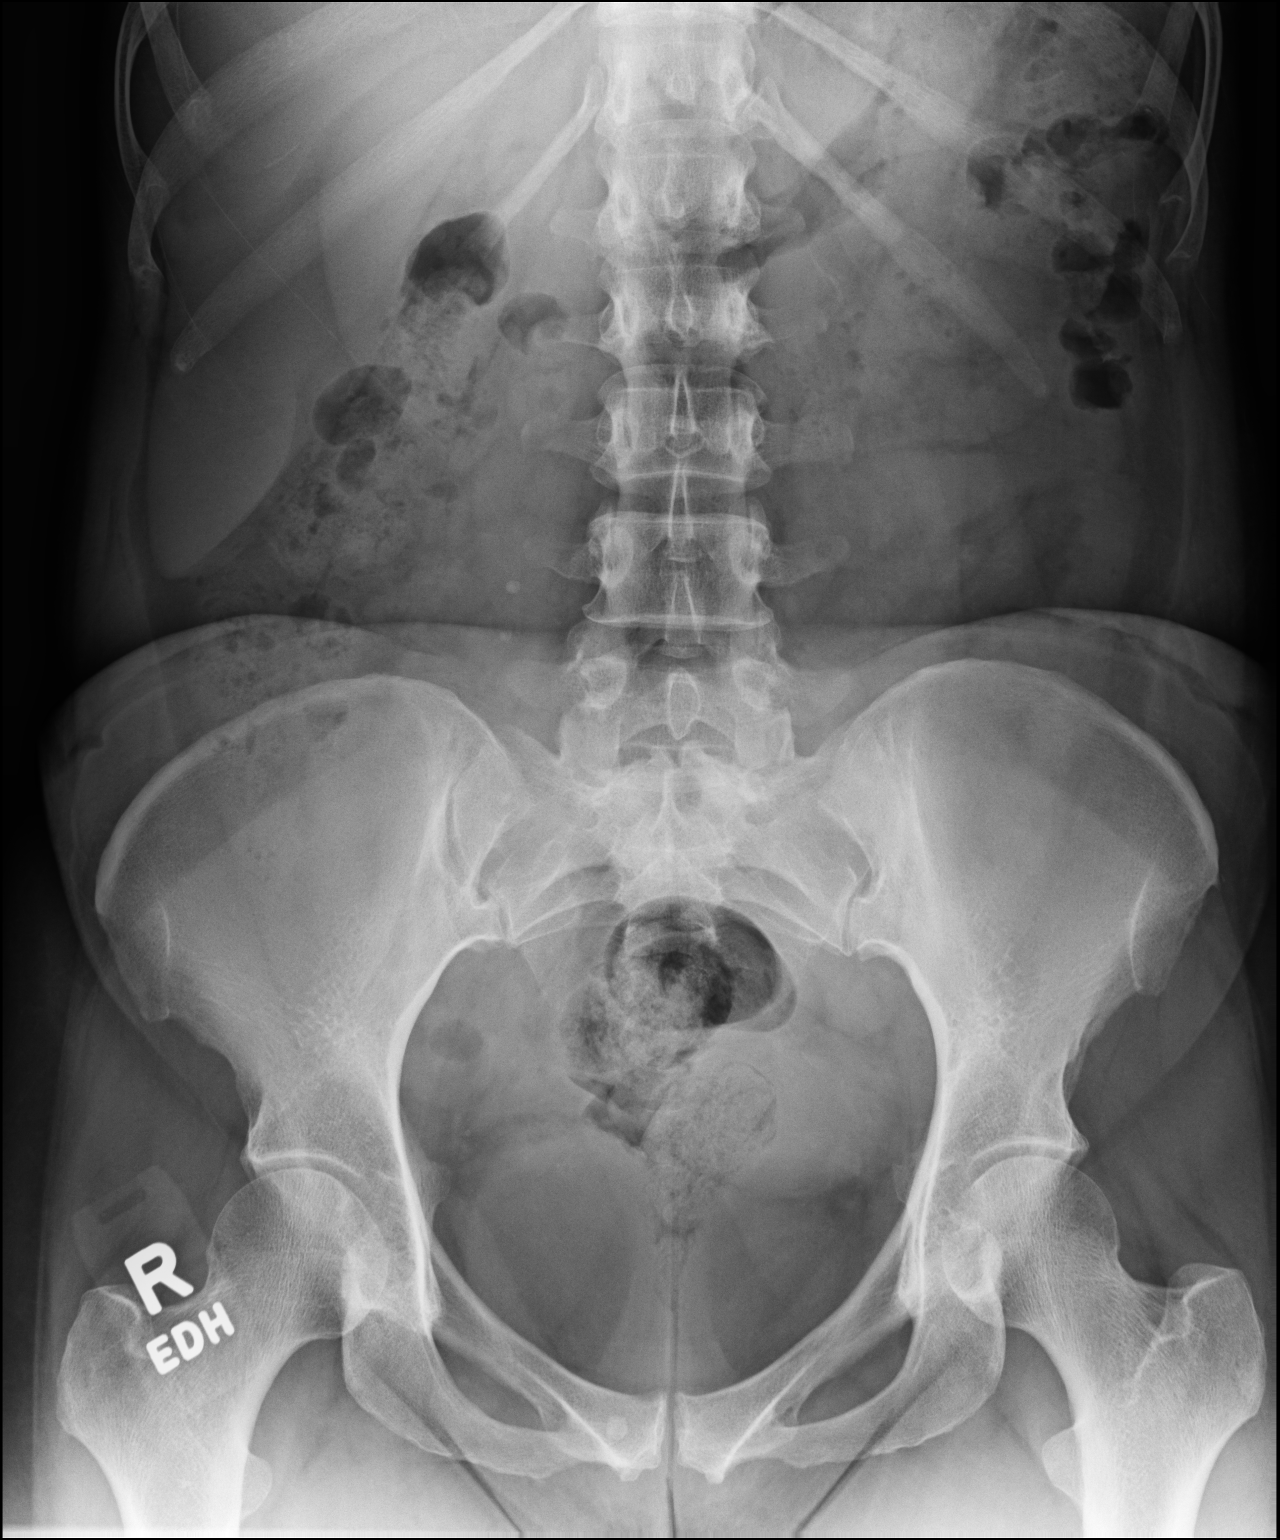

[2 of 2 positions shown; findings below may reference images not displayed]

FINDINGS: Upright film shows no evidence for intraperitoneal free air. There
is no evidence for gaseous bowel dilation to suggest obstruction. No
evidence for radiopaque stone over the expected location of either
kidney. Tiny calcific densities project in the right paramidline
abdomen, superimposed on and just above the right L5 transverse
process. These are probably phleboliths. Ureteral stone is
considered less likely but not excluded. Visualized bony anatomy
unremarkable.
IMPRESSION: 1. No evidence for bowel obstruction or perforation.
2. Probable right abdominal phleboliths. Right ureteral stone is
considered less likely but not excluded. CT imaging could be used to
further evaluate as clinically warranted.

## 2022-12-04 NOTE — H&P (Signed)
H&P  Subjective:  Chief Complaint: Right knee pain.  HPI: Nicole Charles, 47 y.o. female presents for a recheck visit. 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 The patient states that she is unable to fully straighten or bend her knee, with straightening being the more problematic of the two. She mentions that she has adapted to walking with a limp due to the knee's inability to straighten completely. The patient notes that her condition has improved compared to six months ago, but she still experiences significant limitations. She has tried using a brace and continues to exercise as she did in therapy, but these measures have not provided sufficient relief. The patient has been seen at [facility], where a revision to a hinged implant was suggested. It was decided with the patient that the desired outcome (better extension) could potentially be achieved with a less extensive surgery, and a femoral revision was decided upon.  Patient Active Problem List   Diagnosis Date Noted   Knee pain, right anterior 10/22/2018    Past Medical History:  Diagnosis Date   GERD (gastroesophageal reflux disease)    Hypertension 2019   not on meds     Past Surgical History:  Procedure Laterality Date   HERNIA REPAIR  2007   umbilical   KNEE ARTHROSCOPY Right 10/22/2018   Procedure: Right knee arthroscopy; chondroplasty, Meniscal Debridemnt;  Surgeon: Ollen Gross, MD;  Location: [facility];  Service: Orthopedics;  Laterality: Right;    TUBAL LIGATION      Prior to Admission medications   Medication Sig Start Date End Date Taking? Authorizing Provider  calcium carbonate (TUMS - DOSED IN MG ELEMENTAL CALCIUM) 500 MG chewable tablet Chew 1 tablet by mouth daily as needed for indigestion or heartburn.    [provider]  diphenhydramine-acetaminophen (TYLENOL PM) 25-500 MG TABS tablet Take 1 tablet by mouth at bedtime as needed (sleep).     [provider]  HYDROcodone-acetaminophen (NORCO) 5-325 MG tablet Take 1 tablet by mouth every 6 (six) hours as needed for moderate pain. 10/22/18   Ollen Gross, MD  lisinopril (ZESTRIL) 5 MG tablet Take 5 mg by mouth daily. Just started taking this week    [provider]  medroxyPROGESTERone (DEPO-PROVERA) 150 MG/ML injection Inject 150 mg into the muscle every 3 (three) months.    [provider]  methocarbamol (ROBAXIN) 500 MG tablet Take 1 tablet (500 mg total) by mouth every 8 (eight) hours as needed for muscle spasms. As needed for muscle spasm 10/22/18   Ollen Gross, MD    No Known Allergies  Social History   Socioeconomic History   Marital status: Single    Spouse name: Not on file   Number of children: Not on file   Years of education: Not on file   Highest education level: Not on file  Occupational History   Not on file  Tobacco Use   Smoking status: Never   Smokeless tobacco: Never  Vaping Use   Vaping status: Never Used  Substance and Sexual Activity   Alcohol use: Not Currently    Comment: occ.   Drug use: Never   Sexual activity: Not on file  Other Topics Concern   Not on file  Social History Narrative   Not on file   Social Determinants of Health   Financial Resource Strain: Not on file  Food Insecurity: Not on file  Transportation Needs: Not on file  Physical Activity: Not on  file  Stress: Not on file  Social Connections: Not on file  Intimate Partner Violence: Not on file    Tobacco Use: Low Risk  (10/30/2022)   Received from [facility]   Patient History    Smoking Tobacco Use: Never    Smokeless Tobacco Use: Never    Passive Exposure: Not on file   Social History   Substance and Sexual Activity  Alcohol Use Not Currently   Comment: occ.    No family history on file.  Review of Systems  Constitutional:  Negative for chills and fever.  HENT:  Negative for congestion, sore throat and tinnitus.   Eyes:   Negative for double vision, photophobia and pain.  Respiratory:  Negative for cough, shortness of breath and wheezing.   Cardiovascular:  Negative for chest pain, palpitations and orthopnea.  Gastrointestinal:  Negative for heartburn, nausea and vomiting.  Genitourinary:  Negative for dysuria, frequency and urgency.  Musculoskeletal:  Positive for joint pain.  Neurological:  Negative for dizziness, weakness and headaches.    Objective:  Physical Exam: Well nourished and well developed.  General: Alert and oriented x3, cooperative and pleasant, no acute distress.  Head: normocephalic, atraumatic, neck supple.  Eyes: EOMI.  Musculoskeletal: .  - Right knee shows no warmth or effusion.  - Range of motion is approximately 20 to 110.  - No instability noted in the knee.  - Gait pattern is significantly antalgic due to the flexion contracture.  Calves soft and nontender. Motor function intact in LE. Strength 5/5 LE bilaterally. Neuro: Distal pulses 2+. Sensation to light touch intact in LE.  Assessment/Plan:  Flexion contracture, right knee  Therapy Plans: Outpatient therapy at [facility] Disposition: Home with boyfriend Planned DVT Prophylaxis: Aspirin 81 mg BID DME Needed: None PCP: Tresa Res, FNP TXA: IV Allergies: Lisinopril Metal Allergy: None Anesthesia Concerns: None BMI: 25.8 Last HgbA1c: Not diabetic.  Pain Regimen: Oxycodone, tramadol [facility]  Other: - Had clearance sent in from 01/2022, is going to reach out to Valerie's office about either sending clearance or scheduling an appointment  - Patient was instructed on what medications to stop prior to surgery. - Follow-up visit in 2 weeks with Dr. Lequita Halt - Begin physical therapy following surgery - Pre-operative lab work as pre-surgical testing - Prescriptions will be provided in hospital at time of discharge  Arther Abbott, PA-C Orthopedic Surgery [facility] [place]

## 2022-12-19 NOTE — Patient Instructions (Signed)
SURGICAL WAITING ROOM VISITATION  Patients having surgery or a procedure may have no more than 2 support people in the waiting area - these visitors may rotate.    Children under the age of 110 must have an adult with them who is not the patient.  Due to an increase in RSV and influenza rates and associated hospitalizations, children ages 21 and under may not visit patients in [facility] hospitals.  If the patient needs to stay at the hospital during part of their recovery, the visitor guidelines for inpatient rooms apply. Pre-op nurse will coordinate an appropriate time for 1 support person to accompany patient in pre-op.  This support person may not rotate.    Please refer to the [facility] website for the visitor guidelines for Inpatients (after your surgery is over and you are in a regular room).    Your procedure is scheduled on: 01/02/23   Report to [facility] Main Entrance    Report to admitting at 1:00 PM   Call this number if you have problems the morning of surgery [phone]   Do not eat food :After Midnight.   After Midnight you may have the following liquids until 12:30 PM DAY OF SURGERY  Water Non-Citrus Juices (without pulp, NO RED-Apple, White grape, White cranberry) Black Coffee (NO MILK/CREAM OR CREAMERS, sugar ok)  Clear Tea (NO MILK/CREAM OR CREAMERS, sugar ok) regular and decaf                             Plain Jell-O (NO RED)                                           Fruit ices (not with fruit pulp, NO RED)                                     Popsicles (NO RED)                                                               Sports drinks like Gatorade (NO RED)     The day of surgery:  Drink ONE (1) Pre-Surgery Clear Ensure or G2 at 12:30 PM the morning of surgery. Drink in one sitting. Do not sip.  This drink was given to you during your hospital  pre-op appointment visit. Nothing else to drink after completing the  Pre-Surgery Clear Ensure or  G2.          If you have questions, please contact your surgeon's office.   FOLLOW BOWEL PREP AND ANY ADDITIONAL PRE OP INSTRUCTIONS YOU RECEIVED FROM YOUR SURGEON'S OFFICE!!!     Oral Hygiene is also important to reduce your risk of infection.                                    Remember - BRUSH YOUR TEETH THE MORNING OF SURGERY WITH YOUR REGULAR TOOTHPASTE  DENTURES WILL BE REMOVED PRIOR TO SURGERY PLEASE DO NOT  APPLY "Poly grip" OR ADHESIVES!!!   Do NOT smoke after Midnight   Stop all vitamins and herbal supplements 7 days before surgery.   Take these medicines the morning of surgery with A SIP OF WATER: Amlodipine, Buspirone, Carvedilol, Pantoprazole   DO NOT TAKE ANY ORAL DIABETIC MEDICATIONS DAY OF YOUR SURGERY  Bring CPAP mask and tubing day of surgery.                              You may not have any metal on your body including hair pins, jewelry, and body piercing             Do not wear make-up, lotions, powders, perfumes, or deodorant  Do not wear nail polish including gel and S&S, artificial/acrylic nails, or any other type of covering on natural nails including finger and toenails. If you have artificial nails, gel coating, etc. that needs to be removed by a nail salon please have this removed prior to surgery or surgery may need to be canceled/ delayed if the surgeon/ anesthesia feels like they are unable to be safely monitored.   Do not shave  48 hours prior to surgery.    Do not bring valuables to the hospital. [facility] IS NOT             RESPONSIBLE   FOR VALUABLES.   Contacts, glasses, dentures or bridgework may not be worn into surgery.  DO NOT BRING YOUR HOME MEDICATIONS TO THE HOSPITAL. PHARMACY WILL DISPENSE MEDICATIONS LISTED ON YOUR MEDICATION LIST TO YOU DURING YOUR ADMISSION IN THE HOSPITAL!    Patients discharged on the day of surgery will not be allowed to drive home.  Someone NEEDS to stay with you for the first 24 hours after  anesthesia.   Special Instructions: Bring a copy of your healthcare power of attorney and living will documents the day of surgery if you haven't scanned them before.              Please read over the following fact sheets you were given: IF YOU HAVE QUESTIONS ABOUT YOUR PRE-OP INSTRUCTIONS PLEASE CALL [name]   If you received a COVID test during your pre-op visit  it is requested that you wear a mask when out in public, stay away from anyone that may not be feeling well and notify your surgeon if you develop symptoms. If you test positive for Covid or have been in contact with anyone that has tested positive in the last 10 days please notify you surgeon.      Pre-operative 5 CHG Bath Instructions   You can play a key role in reducing the risk of infection after surgery. Your skin needs to be as free of germs as possible. You can reduce the number of germs on your skin by washing with CHG (chlorhexidine gluconate) soap before surgery. CHG is an antiseptic soap that kills germs and continues to kill germs even after washing.   DO NOT use if you have an allergy to chlorhexidine/CHG or antibacterial soaps. If your skin becomes reddened or irritated, stop using the CHG and notify one of our RNs at [phone].   Please shower with the CHG soap starting 4 days before surgery using the following schedule:     Please keep in mind the following:  DO NOT shave, including legs and underarms, starting the day of your first shower.   You may shave your face  at any point before/day of surgery.  Place clean sheets on your bed the day you start using CHG soap. Use a clean washcloth (not used since being washed) for each shower. DO NOT sleep with pets once you start using the CHG.   CHG Shower Instructions:  If you choose to wash your hair and private area, wash first with your normal shampoo/soap.  After you use shampoo/soap, rinse your hair and body thoroughly to remove shampoo/soap  residue.  Turn the water OFF and apply about 3 tablespoons (45 ml) of CHG soap to a CLEAN washcloth.  Apply CHG soap ONLY FROM YOUR NECK DOWN TO YOUR TOES (washing for 3-5 minutes)  DO NOT use CHG soap on face, private areas, open wounds, or sores.  Pay special attention to the area where your surgery is being performed.  If you are having back surgery, having someone wash your back for you may be helpful. Wait 2 minutes after CHG soap is applied, then you may rinse off the CHG soap.  Pat dry with a clean towel  Put on clean clothes/pajamas   If you choose to wear lotion, please use ONLY the CHG-compatible lotions on the back of this paper.     Additional instructions for the day of surgery: DO NOT APPLY any lotions, deodorants, cologne, or perfumes.   Put on clean/comfortable clothes.  Brush your teeth.  Ask your nurse before applying any prescription medications to the skin.      CHG Compatible Lotions   Aveeno Moisturizing lotion  Cetaphil Moisturizing Cream  Cetaphil Moisturizing Lotion  Clairol Herbal Essence Moisturizing Lotion, Dry Skin  Clairol Herbal Essence Moisturizing Lotion, Extra Dry Skin  Clairol Herbal Essence Moisturizing Lotion, Normal Skin  Curel Age Defying Therapeutic Moisturizing Lotion with Alpha Hydroxy  Curel Extreme Care Body Lotion  Curel Soothing Hands Moisturizing Hand Lotion  Curel Therapeutic Moisturizing Cream, Fragrance-Free  Curel Therapeutic Moisturizing Lotion, Fragrance-Free  Curel Therapeutic Moisturizing Lotion, Original Formula  Eucerin Daily Replenishing Lotion  Eucerin Dry Skin Therapy Plus Alpha Hydroxy Crme  Eucerin Dry Skin Therapy Plus Alpha Hydroxy Lotion  Eucerin Original Crme  Eucerin Original Lotion  Eucerin Plus Crme Eucerin Plus Lotion  Eucerin TriLipid Replenishing Lotion  Keri Anti-Bacterial Hand Lotion  Keri Deep Conditioning Original Lotion Dry Skin Formula Softly Scented  Keri Deep Conditioning Original Lotion,  Fragrance Free Sensitive Skin Formula  Keri Lotion Fast Absorbing Fragrance Free Sensitive Skin Formula  Keri Lotion Fast Absorbing Softly Scented Dry Skin Formula  Keri Original Lotion  Keri Skin Renewal Lotion Keri Silky Smooth Lotion  Keri Silky Smooth Sensitive Skin Lotion  Nivea Body Creamy Conditioning Oil  Nivea Body Extra Enriched Teacher, adult education Moisturizing Lotion Nivea Crme  Nivea Skin Firming Lotion  NutraDerm 30 Skin Lotion  NutraDerm Skin Lotion  NutraDerm Therapeutic Skin Cream  NutraDerm Therapeutic Skin Lotion  ProShield Protective Hand Cream  Provon moisturizing lotion  WHAT IS A BLOOD TRANSFUSION? Blood Transfusion Information  A transfusion is the replacement of blood or some of its parts. Blood is made up of multiple cells which provide different functions. Red blood cells carry oxygen and are used for blood loss replacement. White blood cells fight against infection. Platelets control bleeding. Plasma helps clot blood. Other blood products are available for specialized needs, such as hemophilia or other clotting disorders. BEFORE THE TRANSFUSION  Who gives blood for transfusions?  Healthy volunteers who are fully evaluated to make sure their  blood is safe. This is blood bank blood. Transfusion therapy is the safest it has ever been in the practice of medicine. Before blood is taken from a donor, a complete history is taken to make sure that person has no history of diseases nor engages in risky social behavior (examples are intravenous drug use or sexual activity with multiple partners). The donor's travel history is screened to minimize risk of transmitting infections, such as malaria. The donated blood is tested for signs of infectious diseases, such as HIV and hepatitis. The blood is then tested to be sure it is compatible with you in order to minimize the chance of a transfusion reaction. If you or a relative donates blood,  this is often done in anticipation of surgery and is not appropriate for emergency situations. It takes many days to process the donated blood. RISKS AND COMPLICATIONS Although transfusion therapy is very safe and saves many lives, the main dangers of transfusion include:  Getting an infectious disease. Developing a transfusion reaction. This is an allergic reaction to something in the blood you were given. Every precaution is taken to prevent this. The decision to have a blood transfusion has been considered carefully by your caregiver before blood is given. Blood is not given unless the benefits outweigh the risks. AFTER THE TRANSFUSION Right after receiving a blood transfusion, you will usually feel much better and more energetic. This is especially true if your red blood cells have gotten low (anemic). The transfusion raises the level of the red blood cells which carry oxygen, and this usually causes an energy increase. The nurse administering the transfusion will monitor you carefully for complications. HOME CARE INSTRUCTIONS  No special instructions are needed after a transfusion. You may find your energy is better. Speak with your caregiver about any limitations on activity for underlying diseases you may have. SEEK MEDICAL CARE IF:  Your condition is not improving after your transfusion. You develop redness or irritation at the intravenous (IV) site. SEEK IMMEDIATE MEDICAL CARE IF:  Any of the following symptoms occur over the next 12 hours: Shaking chills. You have a temperature by mouth above 102 F (38.9 C), not controlled by medicine. Chest, back, or muscle pain. People around you feel you are not acting correctly or are confused. Shortness of breath or difficulty breathing. Dizziness and fainting. You get a rash or develop hives. You have a decrease in urine output. Your urine turns a dark color or changes to pink, red, or brown. Any of the following symptoms occur over the  next 10 days: You have a temperature by mouth above 102 F (38.9 C), not controlled by medicine. Shortness of breath. Weakness after normal activity. The white part of the eye turns yellow (jaundice). You have a decrease in the amount of urine or are urinating less often. Your urine turns a dark color or changes to pink, red, or brown. Document Released: 02/10/2000 Document Revised: 05/07/2011 Document Reviewed: 09/29/2007 [employer] Patient Information 2014 [place], [place].  _______________________________________________________________________  Incentive Spirometer  An incentive spirometer is a tool that can help keep your lungs clear and active. This tool measures how well you are filling your lungs with each breath. Taking long deep breaths may help reverse or decrease the chance of developing breathing (pulmonary) problems (especially infection) following: A long period of time when you are unable to move or be active. BEFORE THE PROCEDURE  If the spirometer includes an indicator to show your best effort, your nurse or respiratory therapist  will set it to a desired goal. If possible, sit up straight or lean slightly forward. Try not to slouch. Hold the incentive spirometer in an upright position. INSTRUCTIONS FOR USE  Sit on the edge of your bed if possible, or sit up as far as you can in bed or on a chair. Hold the incentive spirometer in an upright position. Breathe out normally. Place the mouthpiece in your mouth and seal your lips tightly around it. Breathe in slowly and as deeply as possible, raising the piston or the ball toward the top of the column. Hold your breath for 3-5 seconds or for as long as possible. Allow the piston or ball to fall to the bottom of the column. Remove the mouthpiece from your mouth and breathe out normally. Rest for a few seconds and repeat Steps 1 through 7 at least 10 times every 1-2 hours when you are awake. Take your time and take a few normal  breaths between deep breaths. The spirometer may include an indicator to show your best effort. Use the indicator as a goal to work toward during each repetition. After each set of 10 deep breaths, practice coughing to be sure your lungs are clear. If you have an incision (the cut made at the time of surgery), support your incision when coughing by placing a pillow or rolled up towels firmly against it. Once you are able to get out of bed, walk around indoors and cough well. You may stop using the incentive spirometer when instructed by your caregiver.  RISKS AND COMPLICATIONS Take your time so you do not get dizzy or light-headed. If you are in pain, you may need to take or ask for pain medication before doing incentive spirometry. It is harder to take a deep breath if you are having pain. AFTER USE Rest and breathe slowly and easily. It can be helpful to keep track of a log of your progress. Your caregiver can provide you with a simple table to help with this. If you are using the spirometer at home, follow these instructions: SEEK MEDICAL CARE IF:  You are having difficultly using the spirometer. You have trouble using the spirometer as often as instructed. Your pain medication is not giving enough relief while using the spirometer. You develop fever of 100.5 F (38.1 C) or higher. SEEK IMMEDIATE MEDICAL CARE IF:  You cough up bloody sputum that had not been present before. You develop fever of 102 F (38.9 C) or greater. You develop worsening pain at or near the incision site. MAKE SURE YOU:  Understand these instructions. Will watch your condition. Will get help right away if you are not doing well or get worse. Document Released: 06/25/2006 Document Revised: 05/07/2011 Document Reviewed: 08/26/2006 [facility] Patient Information 2014 [place], [place].   ________________________________________________________________________

## 2022-12-19 NOTE — Progress Notes (Signed)
COVID Vaccine Completed:  Date of COVID positive in last 90 days:  PCP - Tresa Res, FNP Cardiologist -   Chest x-ray -  EKG -  Stress Test -  ECHO -  Cardiac Cath -  Pacemaker/ICD device last checked: Spinal Cord Stimulator:  Bowel Prep -   Sleep Study -  CPAP -   Fasting Blood Sugar -  Checks Blood Sugar _____ times a day  Last dose of GLP1 agonist-  N/A GLP1 instructions:  N/A   Last dose of SGLT-2 inhibitors-  N/A SGLT-2 instructions: N/A   Blood Thinner Instructions:  Time Aspirin Instructions: Last Dose:  Activity level:  Can go up a flight of stairs and perform activities of daily living without stopping and without symptoms of chest pain or shortness of breath.  Able to exercise without symptoms  Unable to go up a flight of stairs without symptoms of     Anesthesia review:   Patient denies shortness of breath, fever, cough and chest pain at PAT appointment  Patient verbalized understanding of instructions that were given to them at the PAT appointment. Patient was also instructed that they will need to review over the PAT instructions again at home before surgery.

## 2022-12-20 ENCOUNTER — Encounter (HOSPITAL_COMMUNITY)
Admission: RE | Admit: 2022-12-20 | Discharge: 2022-12-20 | Disposition: A | Discharge status: Home / Self Care | Payer: Medicaid Other | Source: Ambulatory Visit | Attending: Orthopedic Surgery | Admitting: Orthopedic Surgery

## 2022-12-20 ENCOUNTER — Encounter (HOSPITAL_COMMUNITY): Payer: Self-pay

## 2022-12-20 ENCOUNTER — Other Ambulatory Visit: Payer: Self-pay

## 2022-12-20 VITALS — BP 148/109 | HR 71 | Temp 98.5°F | Resp 16 | Ht 63.0 in | Wt 136.0 lb

## 2022-12-20 DIAGNOSIS — Z01818 Encounter for other preprocedural examination: Secondary | ICD-10-CM | POA: Insufficient documentation

## 2022-12-20 DIAGNOSIS — I10 Essential (primary) hypertension: Secondary | ICD-10-CM | POA: Insufficient documentation

## 2022-12-20 HISTORY — DX: Headache, unspecified: R51.9

## 2022-12-20 HISTORY — DX: Unspecified osteoarthritis, unspecified site: M19.90

## 2022-12-20 LAB — TYPE AND SCREEN
ABO/RH(D): B POS
Antibody Screen: NEGATIVE

## 2022-12-20 LAB — BASIC METABOLIC PANEL
Anion gap: 8 (ref 5–15)
BUN: 15 mg/dL (ref 6–20)
CO2: 27 mmol/L (ref 22–32)
Calcium: 9.1 mg/dL (ref 8.9–10.3)
Chloride: 104 mmol/L (ref 98–111)
Creatinine, Ser: 0.83 mg/dL (ref 0.44–1.00)
GFR, Estimated: >60 mL/min (ref >60)
Glucose, Bld: 93 mg/dL (ref 70–99)
Potassium: 3.2 mmol/L — ABNORMAL LOW (ref 3.5–5.1)
Sodium: 139 mmol/L (ref 135–145)

## 2022-12-20 LAB — CBC
HCT: 37.3 % (ref 36.0–46.0)
Hemoglobin: 12.3 g/dL (ref 12.0–15.0)
MCH: 31 pg (ref 26.0–34.0)
MCHC: 33 g/dL (ref 30.0–36.0)
MCV: 94 fL (ref 80.0–100.0)
Platelets: 214 10*3/uL (ref 150–400)
RBC: 3.97 MIL/uL (ref 3.87–5.11)
RDW: 12.9 % (ref 11.5–15.5)
WBC: 3.1 10*3/uL — ABNORMAL LOW (ref 4.0–10.5)
nRBC: 0 % (ref 0.0–0.2)

## 2022-12-20 LAB — SURGICAL PCR SCREEN
MRSA, PCR: NEGATIVE
Staphylococcus aureus: NEGATIVE

## 2023-01-02 ENCOUNTER — Ambulatory Visit (HOSPITAL_COMMUNITY): Payer: Medicaid Other

## 2023-01-02 ENCOUNTER — Encounter (HOSPITAL_COMMUNITY)
Admission: RE | Disposition: A | Discharge status: Home / Self Care | Payer: Self-pay | Source: Ambulatory Visit | Attending: Orthopedic Surgery

## 2023-01-02 ENCOUNTER — Ambulatory Visit (HOSPITAL_COMMUNITY): Payer: Medicaid Other | Admitting: Physician Assistant

## 2023-01-02 ENCOUNTER — Other Ambulatory Visit: Payer: Self-pay

## 2023-01-02 ENCOUNTER — Observation Stay (HOSPITAL_COMMUNITY)
Admission: RE | Admit: 2023-01-02 | Discharge: 2023-01-03 | Disposition: A | Discharge status: Home / Self Care | Payer: Medicaid Other | Source: Ambulatory Visit | Attending: Orthopedic Surgery | Admitting: Orthopedic Surgery

## 2023-01-02 ENCOUNTER — Encounter (HOSPITAL_COMMUNITY): Payer: Self-pay | Admitting: Orthopedic Surgery

## 2023-01-02 DIAGNOSIS — M1711 Unilateral primary osteoarthritis, right knee: Secondary | ICD-10-CM | POA: Diagnosis present

## 2023-01-02 DIAGNOSIS — Y792 Prosthetic and other implants, materials and accessory orthopedic devices associated with adverse incidents: Secondary | ICD-10-CM | POA: Insufficient documentation

## 2023-01-02 DIAGNOSIS — T84092A Other mechanical complication of internal right knee prosthesis, initial encounter: Secondary | ICD-10-CM

## 2023-01-02 DIAGNOSIS — I10 Essential (primary) hypertension: Secondary | ICD-10-CM | POA: Insufficient documentation

## 2023-01-02 DIAGNOSIS — Z79899 Other long term (current) drug therapy: Secondary | ICD-10-CM | POA: Insufficient documentation

## 2023-01-02 DIAGNOSIS — Z96659 Presence of unspecified artificial knee joint: Principal | ICD-10-CM

## 2023-01-02 DIAGNOSIS — Y828 Other medical devices associated with adverse incidents: Secondary | ICD-10-CM | POA: Insufficient documentation

## 2023-01-02 DIAGNOSIS — T84018A Broken internal joint prosthesis, other site, initial encounter: Principal | ICD-10-CM

## 2023-01-02 HISTORY — PX: TOTAL KNEE REVISION: SHX996

## 2023-01-02 LAB — POCT PREGNANCY, URINE: Preg Test, Ur: NEGATIVE

## 2023-01-02 LAB — ABO/RH: ABO/RH(D): B POS

## 2023-01-02 SURGERY — TOTAL KNEE REVISION
Anesthesia: Spinal | Site: Knee | Laterality: Right

## 2023-01-02 MED ORDER — ONDANSETRON HCL 4 MG/2ML IJ SOLN
INTRAMUSCULAR | Status: AC
  Filled 2023-01-02: qty 2

## 2023-01-02 MED ORDER — METHOCARBAMOL 1000 MG/10ML IJ SOLN: 500.0000 mg | Freq: Four times a day (QID) | INTRAMUSCULAR | Status: DC | PRN

## 2023-01-02 MED ORDER — SODIUM CHLORIDE (PF) 0.9 % IJ SOLN
INTRAMUSCULAR | Status: DC | PRN
  Administered 2023-01-02: 60 mL

## 2023-01-02 MED ORDER — DEXAMETHASONE SODIUM PHOSPHATE 10 MG/ML IJ SOLN
INTRAMUSCULAR | Status: AC
  Filled 2023-01-02: qty 1

## 2023-01-02 MED ORDER — 0.9 % SODIUM CHLORIDE (POUR BTL) OPTIME
TOPICAL | Status: DC | PRN
  Administered 2023-01-02: 1000 mL

## 2023-01-02 MED ORDER — ROPIVACAINE HCL 7.5 MG/ML IJ SOLN
INTRAMUSCULAR | Status: DC | PRN
  Administered 2023-01-02: 20 mL via PERINEURAL

## 2023-01-02 MED ORDER — CEFAZOLIN SODIUM-DEXTROSE 2-4 GM/100ML-% IV SOLN
2.0000 g | INTRAVENOUS | Status: AC
  Administered 2023-01-02: 2 g via INTRAVENOUS
  Filled 2023-01-02: qty 100

## 2023-01-02 MED ORDER — BISACODYL 10 MG RE SUPP: 10.0000 mg | Freq: Every day | RECTAL | Status: DC | PRN

## 2023-01-02 MED ORDER — TRAMADOL HCL 50 MG PO TABS: 50.0000 mg | ORAL_TABLET | Freq: Four times a day (QID) | ORAL | Status: DC | PRN

## 2023-01-02 MED ORDER — CEFAZOLIN SODIUM-DEXTROSE 2-4 GM/100ML-% IV SOLN
2.0000 g | Freq: Four times a day (QID) | INTRAVENOUS | Status: AC
  Administered 2023-01-02 – 2023-01-03 (×2): 2 g via INTRAVENOUS
  Filled 2023-01-02 (×2): qty 100

## 2023-01-02 MED ORDER — DEXAMETHASONE SODIUM PHOSPHATE 10 MG/ML IJ SOLN
8.0000 mg | Freq: Once | INTRAMUSCULAR | Status: AC
  Administered 2023-01-02: 8 mg via INTRAVENOUS

## 2023-01-02 MED ORDER — DEXAMETHASONE SODIUM PHOSPHATE 10 MG/ML IJ SOLN
10.0000 mg | Freq: Once | INTRAMUSCULAR | Status: AC
  Administered 2023-01-03: 10 mg via INTRAVENOUS
  Filled 2023-01-02: qty 1

## 2023-01-02 MED ORDER — OXYCODONE HCL 5 MG PO TABS
ORAL_TABLET | ORAL | Status: AC
  Administered 2023-01-02: 5 mg via ORAL
  Filled 2023-01-02: qty 1

## 2023-01-02 MED ORDER — ONDANSETRON HCL 4 MG PO TABS: 4.0000 mg | ORAL_TABLET | Freq: Four times a day (QID) | ORAL | Status: DC | PRN

## 2023-01-02 MED ORDER — DIPHENHYDRAMINE HCL 12.5 MG/5ML PO ELIX: 12.5000 mg | ORAL_SOLUTION | ORAL | Status: DC | PRN

## 2023-01-02 MED ORDER — POVIDONE-IODINE 10 % EX SWAB
2.0000 | Freq: Once | CUTANEOUS | Status: AC
  Administered 2023-01-02: 2 via TOPICAL

## 2023-01-02 MED ORDER — METOCLOPRAMIDE HCL 5 MG PO TABS: 5.0000 mg | ORAL_TABLET | Freq: Three times a day (TID) | ORAL | Status: DC | PRN

## 2023-01-02 MED ORDER — METHOCARBAMOL 500 MG PO TABS
500.0000 mg | ORAL_TABLET | Freq: Four times a day (QID) | ORAL | Status: DC | PRN
  Administered 2023-01-03 (×2): 500 mg via ORAL
  Filled 2023-01-02 (×2): qty 1

## 2023-01-02 MED ORDER — CARVEDILOL 12.5 MG PO TABS
12.5000 mg | ORAL_TABLET | Freq: Two times a day (BID) | ORAL | Status: DC
  Administered 2023-01-03: 12.5 mg via ORAL
  Filled 2023-01-02: qty 1

## 2023-01-02 MED ORDER — BUPIVACAINE LIPOSOME 1.3 % IJ SUSP
INTRAMUSCULAR | Status: DC | PRN
  Administered 2023-01-02: 20 mL

## 2023-01-02 MED ORDER — METHOCARBAMOL 1000 MG/10ML IJ SOLN
INTRAMUSCULAR | Status: AC
  Administered 2023-01-02: 500 mg via INTRAVENOUS
  Filled 2023-01-02: qty 10

## 2023-01-02 MED ORDER — FENTANYL CITRATE PF 50 MCG/ML IJ SOSY
PREFILLED_SYRINGE | INTRAMUSCULAR | Status: AC
  Administered 2023-01-02: 50 ug via INTRAVENOUS
  Filled 2023-01-02: qty 1

## 2023-01-02 MED ORDER — MIDAZOLAM HCL 2 MG/2ML IJ SOLN
INTRAMUSCULAR | Status: AC
  Filled 2023-01-02: qty 2

## 2023-01-02 MED ORDER — BUPIVACAINE LIPOSOME 1.3 % IJ SUSP: 20.0000 mL | Freq: Once | INTRAMUSCULAR | Status: DC

## 2023-01-02 MED ORDER — DOCUSATE SODIUM 100 MG PO CAPS
100.0000 mg | ORAL_CAPSULE | Freq: Two times a day (BID) | ORAL | Status: DC
  Administered 2023-01-02 – 2023-01-03 (×2): 100 mg via ORAL
  Filled 2023-01-02 (×2): qty 1

## 2023-01-02 MED ORDER — ACETAMINOPHEN 500 MG PO TABS: 1000.0000 mg | ORAL_TABLET | Freq: Once | ORAL | Status: DC

## 2023-01-02 MED ORDER — SODIUM CHLORIDE (PF) 0.9 % IJ SOLN
INTRAMUSCULAR | Status: AC
  Filled 2023-01-02: qty 10

## 2023-01-02 MED ORDER — TRANEXAMIC ACID-NACL 1000-0.7 MG/100ML-% IV SOLN
1000.0000 mg | INTRAVENOUS | Status: AC
  Administered 2023-01-02: 1000 mg via INTRAVENOUS
  Filled 2023-01-02: qty 100

## 2023-01-02 MED ORDER — GABAPENTIN 300 MG PO CAPS
300.0000 mg | ORAL_CAPSULE | Freq: Three times a day (TID) | ORAL | Status: DC
  Administered 2023-01-02 – 2023-01-03 (×2): 300 mg via ORAL
  Filled 2023-01-02 (×2): qty 1

## 2023-01-02 MED ORDER — SODIUM CHLORIDE 0.9 % IV SOLN: 12.5000 mg | INTRAVENOUS | Status: DC | PRN

## 2023-01-02 MED ORDER — BUPIVACAINE IN DEXTROSE 0.75-8.25 % IT SOLN
INTRATHECAL | Status: DC | PRN
  Administered 2023-01-02: 1.6 mL via INTRATHECAL

## 2023-01-02 MED ORDER — CHLORHEXIDINE GLUCONATE 0.12 % MT SOLN
15.0000 mL | Freq: Once | OROMUCOSAL | Status: AC
  Administered 2023-01-02: 15 mL via OROMUCOSAL

## 2023-01-02 MED ORDER — HYDROCHLOROTHIAZIDE 25 MG PO TABS
25.0000 mg | ORAL_TABLET | Freq: Every day | ORAL | Status: DC
  Administered 2023-01-03: 25 mg via ORAL
  Filled 2023-01-02: qty 1

## 2023-01-02 MED ORDER — PROPOFOL 1000 MG/100ML IV EMUL
INTRAVENOUS | Status: AC
  Filled 2023-01-02: qty 100

## 2023-01-02 MED ORDER — ACETAMINOPHEN 325 MG PO TABS: 325.0000 mg | ORAL_TABLET | Freq: Four times a day (QID) | ORAL | Status: DC | PRN

## 2023-01-02 MED ORDER — FLEET ENEMA RE ENEM: 1.0000 | ENEMA | Freq: Once | RECTAL | Status: DC | PRN

## 2023-01-02 MED ORDER — OXYCODONE HCL 5 MG PO TABS: 5.0000 mg | ORAL_TABLET | Freq: Once | ORAL | Status: AC | PRN

## 2023-01-02 MED ORDER — SODIUM CHLORIDE 0.9 % IV SOLN: INTRAVENOUS | Status: DC

## 2023-01-02 MED ORDER — PROPOFOL 10 MG/ML IV BOLUS
INTRAVENOUS | Status: AC
  Filled 2023-01-02: qty 20

## 2023-01-02 MED ORDER — POLYETHYLENE GLYCOL 3350 17 G PO PACK: 17.0000 g | PACK | Freq: Every day | ORAL | Status: DC | PRN

## 2023-01-02 MED ORDER — AMISULPRIDE (ANTIEMETIC) 5 MG/2ML IV SOLN: 10.0000 mg | Freq: Once | INTRAVENOUS | Status: DC | PRN

## 2023-01-02 MED ORDER — MORPHINE SULFATE (PF) 2 MG/ML IV SOLN
1.0000 mg | INTRAVENOUS | Status: DC | PRN
  Administered 2023-01-02 – 2023-01-03 (×2): 2 mg via INTRAVENOUS
  Filled 2023-01-02 (×2): qty 1

## 2023-01-02 MED ORDER — SODIUM CHLORIDE (PF) 0.9 % IJ SOLN
INTRAMUSCULAR | Status: AC
  Filled 2023-01-02: qty 50

## 2023-01-02 MED ORDER — ONDANSETRON HCL 4 MG/2ML IJ SOLN: 4.0000 mg | Freq: Four times a day (QID) | INTRAMUSCULAR | Status: DC | PRN

## 2023-01-02 MED ORDER — AMLODIPINE BESYLATE 5 MG PO TABS
2.5000 mg | ORAL_TABLET | Freq: Every day | ORAL | Status: DC
Start: 2023-01-03 — End: 2023-01-03
  Administered 2023-01-03: 2.5 mg via ORAL
  Filled 2023-01-02: qty 1

## 2023-01-02 MED ORDER — LACTATED RINGERS IV SOLN: INTRAVENOUS | Status: DC

## 2023-01-02 MED ORDER — OXYCODONE HCL 5 MG PO TABS
5.0000 mg | ORAL_TABLET | ORAL | Status: DC | PRN
  Administered 2023-01-02 – 2023-01-03 (×4): 10 mg via ORAL
  Filled 2023-01-02 (×4): qty 2

## 2023-01-02 MED ORDER — ONDANSETRON HCL 4 MG/2ML IJ SOLN
INTRAMUSCULAR | Status: DC | PRN
  Administered 2023-01-02: 4 mg via INTRAVENOUS

## 2023-01-02 MED ORDER — ACETAMINOPHEN 10 MG/ML IV SOLN
1000.0000 mg | Freq: Four times a day (QID) | INTRAVENOUS | Status: DC
  Administered 2023-01-02: 1000 mg via INTRAVENOUS
  Filled 2023-01-02: qty 100

## 2023-01-02 MED ORDER — ACETAMINOPHEN 500 MG PO TABS: 1000.0000 mg | ORAL_TABLET | Freq: Four times a day (QID) | ORAL | Status: DC

## 2023-01-02 MED ORDER — FENTANYL CITRATE PF 50 MCG/ML IJ SOSY: 25.0000 ug | PREFILLED_SYRINGE | INTRAMUSCULAR | Status: DC | PRN

## 2023-01-02 MED ORDER — BUPIVACAINE LIPOSOME 1.3 % IJ SUSP
INTRAMUSCULAR | Status: AC
  Filled 2023-01-02: qty 20

## 2023-01-02 MED ORDER — ASPIRIN 81 MG PO CHEW
81.0000 mg | CHEWABLE_TABLET | Freq: Two times a day (BID) | ORAL | Status: DC
  Administered 2023-01-03: 81 mg via ORAL
  Filled 2023-01-02: qty 1

## 2023-01-02 MED ORDER — PROPOFOL 10 MG/ML IV BOLUS
INTRAVENOUS | Status: DC | PRN
  Administered 2023-01-02: 50 mg via INTRAVENOUS
  Administered 2023-01-02: 30 mg via INTRAVENOUS
  Administered 2023-01-02: 40 mg via INTRAVENOUS

## 2023-01-02 MED ORDER — ROCURONIUM BROMIDE 10 MG/ML (PF) SYRINGE
PREFILLED_SYRINGE | INTRAVENOUS | Status: AC
  Filled 2023-01-02: qty 10

## 2023-01-02 MED ORDER — ORAL CARE MOUTH RINSE: 15.0000 mL | Freq: Once | OROMUCOSAL | Status: AC

## 2023-01-02 MED ORDER — PHENYLEPHRINE HCL-NACL 20-0.9 MG/250ML-% IV SOLN
INTRAVENOUS | Status: DC | PRN
Start: 2023-01-02 — End: 2023-01-02
  Administered 2023-01-02: 35 ug/min via INTRAVENOUS

## 2023-01-02 MED ORDER — PHENOL 1.4 % MT LIQD: 1.0000 | OROMUCOSAL | Status: DC | PRN

## 2023-01-02 MED ORDER — LACTATED RINGERS IV SOLN
INTRAVENOUS | Status: DC
Start: 2023-01-02 — End: 2023-01-02

## 2023-01-02 MED ORDER — FENTANYL CITRATE PF 50 MCG/ML IJ SOSY
50.0000 ug | PREFILLED_SYRINGE | Freq: Once | INTRAMUSCULAR | Status: AC
  Administered 2023-01-02: 50 ug via INTRAVENOUS
  Filled 2023-01-02: qty 2

## 2023-01-02 MED ORDER — MENTHOL 3 MG MT LOZG: 1.0000 | LOZENGE | OROMUCOSAL | Status: DC | PRN

## 2023-01-02 MED ORDER — PROPOFOL 500 MG/50ML IV EMUL
INTRAVENOUS | Status: DC | PRN
  Administered 2023-01-02: 75 ug/kg/min via INTRAVENOUS

## 2023-01-02 MED ORDER — OXYCODONE HCL 5 MG/5ML PO SOLN: 5.0000 mg | Freq: Once | ORAL | Status: AC | PRN

## 2023-01-02 MED ORDER — MIDAZOLAM HCL 2 MG/2ML IJ SOLN
1.0000 mg | Freq: Once | INTRAMUSCULAR | Status: AC
  Administered 2023-01-02: 2 mg via INTRAVENOUS
  Filled 2023-01-02: qty 2

## 2023-01-02 MED ORDER — PANTOPRAZOLE SODIUM 40 MG PO TBEC
40.0000 mg | DELAYED_RELEASE_TABLET | Freq: Every day | ORAL | Status: DC
  Administered 2023-01-03: 40 mg via ORAL
  Filled 2023-01-02: qty 1

## 2023-01-02 MED ORDER — BUSPIRONE HCL 5 MG PO TABS
5.0000 mg | ORAL_TABLET | Freq: Every day | ORAL | Status: DC
  Administered 2023-01-03: 5 mg via ORAL
  Filled 2023-01-02: qty 1

## 2023-01-02 MED ORDER — METOCLOPRAMIDE HCL 5 MG/ML IJ SOLN: 5.0000 mg | Freq: Three times a day (TID) | INTRAMUSCULAR | Status: DC | PRN

## 2023-01-02 MED ORDER — MIDAZOLAM HCL 5 MG/5ML IJ SOLN
INTRAMUSCULAR | Status: DC | PRN
  Administered 2023-01-02: 2 mg via INTRAVENOUS

## 2023-01-02 SURGICAL SUPPLY — 55 items
ATTUNE PSRP INSR SZ5 6 KNEE (Insert) IMPLANT
BAG COUNTER SPONGE SURGICOUNT (BAG) IMPLANT
BAG SPEC THK2 15X12 ZIP CLS (MISCELLANEOUS)
BAG SPNG CNTER NS LX DISP (BAG)
BAG ZIPLOCK 12X15 (MISCELLANEOUS) IMPLANT
BLADE SAG 18X100X1.27 (BLADE) ×1 IMPLANT
BLADE SAW SGTL 11.0X1.19X90.0M (BLADE) ×1 IMPLANT
BLADE SURG SZ10 CARB STEEL (BLADE) IMPLANT
BNDG CMPR 6 X 5 YARDS HK CLSR (GAUZE/BANDAGES/DRESSINGS) ×1
BNDG ELASTIC 6INX 5YD STR LF (GAUZE/BANDAGES/DRESSINGS) ×1 IMPLANT
BONE CEMENT GENTAMICIN (Cement) ×2 IMPLANT
CEMENT BONE GENTAMICIN 40 (Cement) ×3 IMPLANT
COMP FEM ATTUNE CRS CEM RT SZ5 (Femur) ×1 IMPLANT
COMPONENT FEM ATN CR CEM RTSZ5 (Femur) IMPLANT
COVER SURGICAL LIGHT HANDLE (MISCELLANEOUS) ×1 IMPLANT
CUFF TOURN SGL QUICK 34 (TOURNIQUET CUFF) ×1
CUFF TRNQT CYL 34X4.125X (TOURNIQUET CUFF) ×1 IMPLANT
DRAPE U-SHAPE 47X51 STRL (DRAPES) ×1 IMPLANT
DRSG AQUACEL AG ADV 3.5X10 (GAUZE/BANDAGES/DRESSINGS) ×1 IMPLANT
DURAPREP 26ML APPLICATOR (WOUND CARE) ×1 IMPLANT
ELECT REM PT RETURN 15FT ADLT (MISCELLANEOUS) ×1 IMPLANT
GLOVE BIO SURGEON STRL SZ 6.5 (GLOVE) IMPLANT
GLOVE BIO SURGEON STRL SZ7 (GLOVE) IMPLANT
GLOVE BIO SURGEON STRL SZ8 (GLOVE) ×2 IMPLANT
GLOVE BIOGEL PI IND STRL 7.0 (GLOVE) IMPLANT
GLOVE BIOGEL PI IND STRL 8 (GLOVE) ×1 IMPLANT
GOWN STRL REUS W/ TWL LRG LVL3 (GOWN DISPOSABLE) ×1 IMPLANT
GOWN STRL REUS W/TWL LRG LVL3 (GOWN DISPOSABLE) ×1
HANDPIECE INTERPULSE COAX TIP (DISPOSABLE) ×1
HOLDER FOLEY CATH W/STRAP (MISCELLANEOUS) IMPLANT
IMMOBILIZER KNEE 20 (SOFTGOODS) ×1
IMMOBILIZER KNEE 20 THIGH 36 (SOFTGOODS) ×1 IMPLANT
KIT TURNOVER KIT A (KITS) IMPLANT
MANIFOLD NEPTUNE II (INSTRUMENTS) ×1 IMPLANT
NS IRRIG 1000ML POUR BTL (IV SOLUTION) ×1 IMPLANT
PACK TOTAL KNEE CUSTOM (KITS) ×1 IMPLANT
PADDING CAST COTTON 6X4 STRL (CAST SUPPLIES) ×2 IMPLANT
PROTECTOR NERVE ULNAR (MISCELLANEOUS) ×1 IMPLANT
SET HNDPC FAN SPRY TIP SCT (DISPOSABLE) ×1 IMPLANT
SPIKE FLUID TRANSFER (MISCELLANEOUS) IMPLANT
STEM STR ATTUNE PF 16X60 (Knees) IMPLANT
STRIP CLOSURE SKIN 1/2X4 (GAUZE/BANDAGES/DRESSINGS) ×2 IMPLANT
SUT MNCRL AB 4-0 PS2 18 (SUTURE) ×1 IMPLANT
SUT STRATAFIX 0 PDS 27 VIOLET (SUTURE) ×1
SUT VIC AB 2-0 CT1 27 (SUTURE) ×3
SUT VIC AB 2-0 CT1 TAPERPNT 27 (SUTURE) ×3 IMPLANT
SUTURE STRATFX 0 PDS 27 VIOLET (SUTURE) ×1 IMPLANT
SWAB COLLECTION DEVICE MRSA (MISCELLANEOUS) IMPLANT
SWAB CULTURE ESWAB REG 1ML (MISCELLANEOUS) IMPLANT
TOWER CARTRIDGE SMART MIX (DISPOSABLE) ×1 IMPLANT
TRAY FOLEY MTR SLVR 16FR STAT (SET/KITS/TRAYS/PACK) ×1 IMPLANT
TUBE KAMVAC SUCTION (TUBING) IMPLANT
TUBE SUCTION HIGH CAP CLEAR NV (SUCTIONS) ×1 IMPLANT
WATER STERILE IRR 1000ML POUR (IV SOLUTION) ×1 IMPLANT
WRAP KNEE MAXI GEL POST OP (GAUZE/BANDAGES/DRESSINGS) IMPLANT

## 2023-01-02 NOTE — Anesthesia Procedure Notes (Signed)
Spinal  Patient location during procedure: OR Start time: 01/02/2023 4:17 PM End time: 01/02/2023 4:19 PM Reason for block: surgical anesthesia Staffing Performed: anesthesiologist  Anesthesiologist: Beryle Lathe, MD Performed by: Beryle Lathe, MD Authorized by: Beryle Lathe, MD   Preanesthetic Checklist Completed: patient identified, IV checked, risks and benefits discussed, surgical consent, monitors and equipment checked, pre-op evaluation and timeout performed Spinal Block Patient position: sitting Prep: DuraPrep Patient monitoring: heart rate, cardiac monitor, continuous pulse ox and blood pressure Approach: midline Location: L2-3 Injection technique: single-shot Needle Needle type: Pencan  Needle gauge: 24 G Additional Notes Consent was obtained prior to the procedure with all questions answered and concerns addressed. Risks including, but not limited to, bleeding, infection, nerve damage, paralysis, failed block, inadequate analgesia, allergic reaction, high spinal, itching, and headache were discussed and the patient wished to proceed. Functioning IV was confirmed and monitors were applied. Sterile prep and drape, including hand hygiene, mask, and sterile gloves were used. The patient was positioned and the spine was prepped. The skin was anesthetized with lidocaine. Free flow of clear CSF was obtained prior to injecting local anesthetic into the CSF. The spinal needle aspirated freely following injection. The needle was carefully withdrawn. The patient tolerated the procedure well.   Leslye Peer, MD

## 2023-01-02 NOTE — Anesthesia Procedure Notes (Signed)
Anesthesia Regional Block: Adductor canal block   Pre-Anesthetic Checklist: , timeout performed,  Correct Patient, Correct Site, Correct Laterality,  Correct Procedure, Correct Position, site marked,  Risks and benefits discussed,  Surgical consent,  Pre-op evaluation,  At surgeon's request and post-op pain management  Laterality: Right  Prep: chloraprep       Needles:  Injection technique: Single-shot  Needle Type: Echogenic Needle     Needle Length: 10cm  Needle Gauge: 21     Additional Needles:   Narrative:  Start time: 01/02/2023 3:07 PM End time: 01/02/2023 3:10 PM Injection made incrementally with aspirations every 5 mL.  Performed by: Personally  Anesthesiologist: Beryle Lathe, MD  Additional Notes: No pain on injection. No increased resistance to injection. Injection made in 5cc increments. Good needle visualization. Patient tolerated the procedure well.

## 2023-01-02 NOTE — Discharge Instructions (Addendum)
Ollen Gross, MD Total Joint Specialist [facility] [address], [address] [place], [place] [address] [phone]  KNEE REPLACEMENT POSTOPERATIVE DIRECTIONS  Knee Rehabilitation, Guidelines Following Surgery  Results after knee surgery are often greatly improved when you follow the exercise, range of motion and muscle strengthening exercises prescribed by your doctor. Safety measures are also important to protect the knee from further injury. If any of these exercises cause you to have increased pain or swelling in your knee joint, decrease the amount until you are comfortable again and slowly increase them. If you have problems or questions, call your caregiver or physical therapist for advice.   BLOOD CLOT PREVENTION Take an 81 mg Aspirin two times a day for three weeks following surgery. Then take an 81 mg Aspirin once a day for three weeks. Then discontinue Aspirin. You may resume your vitamins/supplements upon discharge from the hospital. Do not take any NSAIDs (Advil, Aleve, Ibuprofen, Meloxicam, etc.) until you have discontinued the 81 mg Aspirin twice a day.  HOME CARE INSTRUCTIONS  Remove items at home which could result in a fall. This includes throw rugs or furniture in walking pathways.  ICE to the affected knee as much as tolerated. Icing helps control swelling. If the swelling is well controlled you will be more comfortable and rehab easier. Continue to use ice on the knee for pain and swelling from surgery. You may notice swelling that will progress down to the foot and ankle. This is normal after surgery. Elevate the leg when you are not up walking on it.    Continue to use the breathing machine which will help keep your temperature down. It is common for your temperature to cycle up and down following surgery, especially at night when you are not up moving around and exerting yourself. The breathing machine keeps your lungs expanded and your temperature  down. Do not place pillow under the operative knee, focus on keeping the knee straight while resting  DIET You may resume your previous home diet once you are discharged from the hospital.  DRESSING / WOUND CARE / SHOWERING Keep your bulky bandage on for 2 days. On the third post-operative day you may remove the Ace bandage and gauze. There is a waterproof adhesive bandage on your skin which will stay in place until your first follow-up appointment. Once you remove this you will not need to place another bandage You may begin showering 3 days following surgery, but do not submerge the incision under water.  ACTIVITY For the first 5 days, the key is rest and control of pain and swelling Do your home exercises twice a day starting on post-operative day 3. On the days you go to physical therapy, just do the home exercises once that day. You should rest, ice and elevate the leg for 50 minutes out of every hour. Get up and walk/stretch for 10 minutes per hour. After 5 days you can increase your activity slowly as tolerated. Walk with your walker as instructed. Use the walker until you are comfortable transitioning to a cane. Walk with the cane in the opposite hand of the operative leg. You may discontinue the cane once you are comfortable and walking steadily. Avoid periods of inactivity such as sitting longer than an hour when not asleep. This helps prevent blood clots.  You may discontinue the knee immobilizer once you are able to perform a straight leg raise while lying down. You may resume a sexual relationship in one month  or when given the OK by your doctor.  You may return to work once you are cleared by your doctor.  Do not drive a car for 6 weeks or until released by your surgeon.  Do not drive while taking narcotics.  TED HOSE STOCKINGS Wear the elastic stockings on both legs for three weeks following surgery during the day. You may remove them at night for sleeping.  WEIGHT  BEARING Weight bearing as tolerated with assist device (walker, cane, etc) as directed, use it as long as suggested by your surgeon or therapist, typically at least 4-6 weeks.  POSTOPERATIVE CONSTIPATION PROTOCOL Constipation - defined medically as fewer than three stools per week and severe constipation as less than one stool per week.  One of the most common issues patients have following surgery is constipation.  Even if you have a regular bowel pattern at home, your normal regimen is likely to be disrupted due to multiple reasons following surgery.  Combination of anesthesia, postoperative narcotics, change in appetite and fluid intake all can affect your bowels.  In order to avoid complications following surgery, here are some recommendations in order to help you during your recovery period.  Colace (docusate) - Pick up an over-the-counter form of Colace or another stool softener and take twice a day as long as you are requiring postoperative pain medications.  Take with a full glass of water daily.  If you experience loose stools or diarrhea, hold the colace until you stool forms back up. If your symptoms do not get better within 1 week or if they get worse, check with your doctor. Dulcolax (bisacodyl) - Pick up over-the-counter and take as directed by the product packaging as needed to assist with the movement of your bowels.  Take with a full glass of water.  Use this product as needed if not relieved by Colace only.  MiraLax (polyethylene glycol) - Pick up over-the-counter to have on hand. MiraLax is a solution that will increase the amount of water in your bowels to assist with bowel movements.  Take as directed and can mix with a glass of water, juice, soda, coffee, or tea. Take if you go more than two days without a movement. Do not use MiraLax more than once per day. Call your doctor if you are still constipated or irregular after using this medication for 7 days in a row.  If you continue  to have problems with postoperative constipation, please contact the office for further assistance and recommendations.  If you experience "the worst abdominal pain ever" or develop nausea or vomiting, please contact the office immediatly for further recommendations for treatment.  ITCHING If you experience itching with your medications, try taking only a single pain pill, or even half a pain pill at a time.  You can also use Benadryl over the counter for itching or also to help with sleep.   MEDICATIONS See your medication summary on the "After Visit Summary" that the nursing staff will review with you prior to discharge.  You may have some home medications which will be placed on hold until you complete the course of blood thinner medication.  It is important for you to complete the blood thinner medication as prescribed by your surgeon.  Continue your approved medications as instructed at time of discharge.  PRECAUTIONS If you experience chest pain or shortness of breath - call 911 immediately for transfer to the hospital emergency department.  If you develop a fever greater that 101 F,  purulent drainage from wound, increased redness or drainage from wound, foul odor from the wound/dressing, or calf pain - CONTACT YOUR SURGEON.                                                   FOLLOW-UP APPOINTMENTS Make sure you keep all of your appointments after your operation with your surgeon and caregivers. You should call the office at the above phone number and make an appointment for approximately two weeks after the date of your surgery or on the date instructed by your surgeon outlined in the "After Visit Summary".  RANGE OF MOTION AND STRENGTHENING EXERCISES  Rehabilitation of the knee is important following a knee injury or an operation. After just a few days of immobilization, the muscles of the thigh which control the knee become weakened and shrink (atrophy). Knee exercises are designed to build up  the tone and strength of the thigh muscles and to improve knee motion. Often times heat used for twenty to thirty minutes before working out will loosen up your tissues and help with improving the range of motion but do not use heat for the first two weeks following surgery. These exercises can be done on a training (exercise) mat, on the floor, on a table or on a bed. Use what ever works the best and is most comfortable for you Knee exercises include:  Leg Lifts - While your knee is still immobilized in a splint or cast, you can do straight leg raises. Lift the leg to 60 degrees, hold for 3 sec, and slowly lower the leg. Repeat 10-20 times 2-3 times daily. Perform this exercise against resistance later as your knee gets better.  Quad and Hamstring Sets - Tighten up the muscle on the front of the thigh (Quad) and hold for 5-10 sec. Repeat this 10-20 times hourly. Hamstring sets are done by pushing the foot backward against an object and holding for 5-10 sec. Repeat as with quad sets.  Leg Slides: Lying on your back, slowly slide your foot toward your buttocks, bending your knee up off the floor (only go as far as is comfortable). Then slowly slide your foot back down until your leg is flat on the floor again. Angel Wings: Lying on your back spread your legs to the side as far apart as you can without causing discomfort.  A rehabilitation program following serious knee injuries can speed recovery and prevent re-injury in the future due to weakened muscles. Contact your doctor or a physical therapist for more information on knee rehabilitation.   POST-OPERATIVE OPIOID TAPER INSTRUCTIONS: It is important to wean off of your opioid medication as soon as possible. If you do not need pain medication after your surgery it is ok to stop day one. Opioids include: Codeine, Hydrocodone(Norco, Vicodin), Oxycodone(Percocet, oxycontin) and hydromorphone amongst others.  Long term and even short term use of opiods can  cause: Increased pain response Dependence Constipation Depression Respiratory depression And more.  Withdrawal symptoms can include Flu like symptoms Nausea, vomiting And more Techniques to manage these symptoms Hydrate well Eat regular healthy meals Stay active Use relaxation techniques(deep breathing, meditating, yoga) Do Not substitute Alcohol to help with tapering If you have been on opioids for less than two weeks and do not have pain than it is ok to stop all together.  Plan  to wean off of opioids This plan should start within one week post op of your joint replacement. Maintain the same interval or time between taking each dose and first decrease the dose.  Cut the total daily intake of opioids by one tablet each day Next start to increase the time between doses. The last dose that should be eliminated is the evening dose.   IF YOU ARE TRANSFERRED TO A SKILLED REHAB FACILITY If the patient is transferred to a skilled rehab facility following release from the hospital, a list of the current medications will be sent to the facility for the patient to continue.  When discharged from the skilled rehab facility, please have the facility set up the patient's Home Health Physical Therapy prior to being released. Also, the skilled facility will be responsible for providing the patient with their medications at time of release from the facility to include their pain medication, the muscle relaxants, and their blood thinner medication. If the patient is still at the rehab facility at time of the two week follow up appointment, the skilled rehab facility will also need to assist the patient in arranging follow up appointment in our office and any transportation needs.  MAKE SURE YOU:  Understand these instructions.  Get help right away if you are not doing well or get worse.   DENTAL ANTIBIOTICS:  In most cases prophylactic antibiotics for Dental procdeures after total joint surgery are  not necessary.  Exceptions are as follows:  1. History of prior total joint infection  2. Severely immunocompromised (Organ Transplant, cancer chemotherapy, Rheumatoid biologic medications such as Humera)  3. Poorly controlled diabetes (A1C &gt; 8.0, blood glucose over 200)  If you have one of these conditions, contact your surgeon for an antibiotic prescription, prior to your dental procedure.    Pick up stool softner and laxative for home use following surgery while on pain medications. Do not submerge incision under water. Please use good hand washing techniques while changing dressing each day. May shower starting three days after surgery. Please use a clean towel to pat the incision dry following showers. Continue to use ice for pain and swelling after surgery. Do not use any lotions or creams on the incision until instructed by your surgeon.

## 2023-01-02 NOTE — Progress Notes (Signed)
Orthopedic Tech Progress Note Patient Details:  Nicole Charles 06-Jul-1975 [id number]  Ortho Devices Type of Ortho Device: CPM padding Ortho Device/Splint Location: right Ortho Device/Splint Interventions: Ordered, Application, Adjustment   Post Interventions Patient Tolerated: Well Instructions Provided: Care of device, Adjustment of device  Kizzie Fantasia 01/02/2023, 6:33 PM

## 2023-01-02 NOTE — Anesthesia Procedure Notes (Signed)
Date/Time: 01/02/2023 4:20 PM  Performed by: Maurene Capes, CRNAOxygen Delivery Method: Simple face mask Placement Confirmation: positive ETCO2 Dental Injury: Teeth and Oropharynx as per pre-operative assessment

## 2023-01-02 NOTE — Progress Notes (Signed)
Orthopedic Tech Progress Note Patient Details:  Nicole Charles 18-Nov-1975 [id number]  CPM Right Knee CPM Right Knee: On Right Knee Flexion (Degrees): 40 Right Knee Extension (Degrees): 10  Post Interventions Patient Tolerated: Well Instructions Provided: Care of device, Adjustment of device  Kizzie Fantasia 01/02/2023, 6:32 PM

## 2023-01-02 NOTE — Brief Op Note (Signed)
01/02/2023  5:38 PM  PATIENT:  Nicole Charles  47 y.o. female  PRE-OPERATIVE DIAGNOSIS:  failed right total knee arthroplasty; flexion contracture  POST-OPERATIVE DIAGNOSIS:  failed right total knee arthroplasty; flexion contracture  PROCEDURE:  Procedure(s): Right total knee arthroplasty femoral revision (Right)  SURGEON:  Surgeons and Role:    Ollen Gross, MD - Primary  PHYSICIAN ASSISTANT:   ASSISTANTS: Arcola Jansky, PA-C   ANESTHESIA:   regional and spinal  EBL:  25 mL   BLOOD ADMINISTERED:none  DRAINS: none   LOCAL MEDICATIONS USED:  OTHER Exparel  COUNTS:  YES  TOURNIQUET:   Total Tourniquet Time Documented: Thigh (Left) - 53 minutes Total: Thigh (Left) - 53 minutes   DICTATION: .Other Dictation: Dictation Number [id number]  PLAN OF CARE: Admit to inpatient   PATIENT DISPOSITION:  PACU - hemodynamically stable.

## 2023-01-02 NOTE — Anesthesia Procedure Notes (Signed)
Procedure Name: MAC Date/Time: 01/02/2023 4:11 PM  Performed by: Maurene Capes, CRNAPre-anesthesia Checklist: Patient identified, Emergency Drugs available, Suction available, Patient being monitored and Timeout performed Patient Re-evaluated:Patient Re-evaluated prior to induction Oxygen Delivery Method: Nasal cannula Placement Confirmation: positive ETCO2 Dental Injury: Teeth and Oropharynx as per pre-operative assessment

## 2023-01-02 NOTE — Anesthesia Preprocedure Evaluation (Addendum)
Anesthesia Evaluation  Patient identified by MRN, date of birth, ID band Patient awake    Reviewed: Allergy & Precautions, NPO status , Patient's Chart, lab work & pertinent test results, reviewed documented beta blocker date and time   History of Anesthesia Complications Negative for: history of anesthetic complications  Airway Mallampati: II  TM Distance: >3 FB Neck ROM: Full    Dental  (+) Dental Advisory Given, Teeth Intact   Pulmonary neg pulmonary ROS   Pulmonary exam normal        Cardiovascular hypertension, Pt. on medications and Pt. on home beta blockers Normal cardiovascular exam     Neuro/Psych  Headaches  negative psych ROS   GI/Hepatic Neg liver ROS,GERD  Medicated and Controlled,,  Endo/Other   K 3.2   Renal/GU negative Renal ROS     Musculoskeletal  (+) Arthritis ,    Abdominal   Peds  Hematology negative hematology ROS (+)  Plt 214k    Anesthesia Other Findings   Reproductive/Obstetrics  s/p tubal ligation                               Anesthesia Physical Anesthesia Plan  ASA: 2  Anesthesia Plan: Spinal   Post-op Pain Management: Tylenol PO (pre-op)* and Regional block*   Induction:   PONV Risk Score and Plan: 2 and Treatment may vary due to age or medical condition and Propofol infusion  Airway Management Planned: Natural Airway and Simple Face Mask  Additional Equipment: None  Intra-op Plan:   Post-operative Plan:   Informed Consent: I have reviewed the patients History and Physical, chart, labs and discussed the procedure including the risks, benefits and alternatives for the proposed anesthesia with the patient or authorized representative who has indicated his/her understanding and acceptance.       Plan Discussed with: CRNA, Anesthesiologist and Surgeon  Anesthesia Plan Comments: (Labs reviewed, platelets acceptable. Discussed risks and  benefits of spinal, including spinal/epidural hematoma, infection, failed block, and PDPH. Patient expressed understanding and wished to proceed. )        Anesthesia Quick Evaluation

## 2023-01-02 NOTE — Op Note (Unsigned)
NAMEMACEY, Nicole Charles MEDICAL RECORD NO: [id number] ACCOUNT NO: [id number] DATE OF BIRTH: [birth year] FACILITY: [facility] LOCATION: [id number] PHYSICIAN: Gus Rankin. 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  PROCEDURE PERFORMED:  Right femoral revision of total knee arthroplasty.  SURGEON:  Gus Rankin. Reynald Woods, MD  ASSISTANT:  Arcola Jansky, PA-C   ANESTHESIA:  Adductor canal block and spinal.  ESTIMATED BLOOD LOSS:  25 mL.  DRAINS:  None.  TOURNIQUET TIME:  53 minutes at 300 mmHg.  COMPLICATIONS:  None   CONDITION:  Stable, to recovery.  BRIEF CLINICAL NOTE:  The patient is a 47 year old female who had a right total knee arthroplasty performed a little over a year ago.  She has had difficulty obtaining range of motion, especially getting her extension.  She has had extensive physical  therapy.  She had a closed manipulation for flexion and ended up getting about 115 degrees of flexion.  She had about a 20 to 25-degree flexion contracture, which was refractory to all means of treatment including bracing and physical therapy.  She  presents now for femoral revision to increase the extension gap and allow for full extension.  DESCRIPTION OF PROCEDURE:  After successful administration of adductor canal block and spinal, tourniquet was placed high on the right thigh and her right lower extremity.  She was prepped and draped in the usual sterile fashion.  Extremities were  wrapped in Esmarch and tourniquet inflated to 300 mmHg.  Midline incision was made with a #10 blade through the subcutaneous tissue to the level of the extensor mechanism.  A fresh blade was used to make a medial parapatellar arthrotomy.  Soft tissue in  the proximal medial tibia was subperiosteally elevated to the  joint line with a knife and into the semimembranosus bursa with a Cobb elevator.  Soft tissue laterally was elevated with attention being paid to avoiding the patellar tendon on the tibial  tubercle.  The patella was subluxed laterally.  We then removed the scar tissue from underneath the extensor mechanism.  Once we got to the infrapatellar area, I removed enough scar tissue so the patella everted easily.  The knee was then flexed 90  degrees.  The tibial polyethylene was then removed from the tibial tray.  At this point, we used osteotomes to disrupt the interface between the femoral component and bone and removed the femoral component with minimal to no bone loss.  I then gained  access to the femoral canal and thoroughly irrigated the canal.  I reamed up to 16 mm for a 16 x 80 stem.  We then placed the left reamer in to serve as our alignment rod for the distal femoral cut.  I took a total of 8 mm off the distal femur.  We then  completed the femoral preparation with the chamfer and intercondylar cuts.  Size 5 Attune revision trial with the 16 x 60 stem was placed.  She previously had a 6 mm insert and we tried a 6 mm trial.  With that 6 mm trial, she got within 5 degrees of  full extension.  I then took the trial out and released some of the posterior capsule off of the posterior condyles with a Cobb.  We then put the trial back on and I was able to get her to just about full  extension.   All trials were removed and cut bone surface repaired with pulsatile lavage.  The size 5 Attune revision femur with a 16 x 60 stem is configured on the back table.  Two batches of gentamicin impregnated cement were mixed and the femoral component is  cemented distally with the press-fit stem in the canal.  This was impacted and all extruded cements were removed.  A 6-mm insert was placed for the trial and knee held in full extension.  All extruded cement removed.  When the cement was hardened, then a  permanent 6-mm  posterior stabilized rotating platform inserted for the Attune was placed.  The knee was reduced with excellent stability throughout full range of motion.  I was able to get her to full extension.  20 mL of Exparel mixed with 60 mL of saline injected into the extensor mechanism, subcutaneous tissues, and periosteum in the femur.  The knee was then copiously irrigated with saline solution using pulsatile lavage.  Arthrotomy was closed with a running  0 Stratafix suture.  Flexion against gravity was about 130 degrees and the patella tracks normally.  Tourniquet was then released.  Total time of 53 minutes.  Subcutaneous closed with interrupted 2-0 Vicryl and subcuticular running 4-0 Monocryl.   Incision was cleaned and dried and a bulky sterile dressing applied.  She was then placed into a knee immobilizer, awakened and transported to recovery in stable condition.  NEED FOR ASSISTANT:  Note that the surgical assistant is the medical necessity for this procedure to do it in a safe and expeditious manner.  Surgical assistant is necessary for retraction of vital ligaments and neurovascular structures and proper  positioning of the limb with safe removal of the old implant and safe and accurate placement of the new implant.   MUK D: 01/02/2023 5:45:53 pm T: 01/02/2023 10:19:00 pm  JOB: [id number]/ [id number]

## 2023-01-02 NOTE — Transfer of Care (Signed)
Immediate Anesthesia Transfer of Care Note  Patient: Nicole Charles  Procedure(s) Performed: Right total knee arthroplasty femoral revision (Right: Knee)  Patient Location: PACU  Anesthesia Type:Spinal and MAC combined with regional for post-op pain  Level of Consciousness: drowsy and patient cooperative  Airway & Oxygen Therapy: Patient Spontanous Breathing and Patient connected to nasal cannula oxygen  Post-op Assessment: Report given to RN and Post -op Vital signs reviewed and stable  Post vital signs: Reviewed and stable  Last Vitals:  Vitals Value Taken Time  BP 125/83 01/02/23 1810  Temp    Pulse 56 01/02/23 1812  Resp 17 01/02/23 1812  SpO2 100 % 01/02/23 1812  Vitals shown include unfiled device data.  Last Pain:  Vitals:   01/02/23 1511  TempSrc:   PainSc: 0-No pain         Complications: No notable events documented.

## 2023-01-03 ENCOUNTER — Encounter (HOSPITAL_COMMUNITY): Payer: Self-pay | Admitting: Orthopedic Surgery

## 2023-01-03 ENCOUNTER — Other Ambulatory Visit (HOSPITAL_COMMUNITY): Payer: Self-pay

## 2023-01-03 DIAGNOSIS — T84092A Other mechanical complication of internal right knee prosthesis, initial encounter: Secondary | ICD-10-CM | POA: Diagnosis not present

## 2023-01-03 LAB — BASIC METABOLIC PANEL
Anion gap: 10 (ref 5–15)
BUN: 12 mg/dL (ref 6–20)
CO2: 22 mmol/L (ref 22–32)
Calcium: 8.7 mg/dL — ABNORMAL LOW (ref 8.9–10.3)
Chloride: 105 mmol/L (ref 98–111)
Creatinine, Ser: 0.64 mg/dL (ref 0.44–1.00)
GFR, Estimated: >60 mL/min (ref >60)
Glucose, Bld: 156 mg/dL — ABNORMAL HIGH (ref 70–99)
Potassium: 3.4 mmol/L — ABNORMAL LOW (ref 3.5–5.1)
Sodium: 137 mmol/L (ref 135–145)

## 2023-01-03 LAB — CBC
HCT: 35.3 % — ABNORMAL LOW (ref 36.0–46.0)
Hemoglobin: 11.1 g/dL — ABNORMAL LOW (ref 12.0–15.0)
MCH: 31 pg (ref 26.0–34.0)
MCHC: 31.4 g/dL (ref 30.0–36.0)
MCV: 98.6 fL (ref 80.0–100.0)
Platelets: 214 10*3/uL (ref 150–400)
RBC: 3.58 MIL/uL — ABNORMAL LOW (ref 3.87–5.11)
RDW: 13.2 % (ref 11.5–15.5)
WBC: 9.7 10*3/uL (ref 4.0–10.5)
nRBC: 0 % (ref 0.0–0.2)

## 2023-01-03 MED ORDER — OXYCODONE HCL 5 MG PO TABS: 5.0000 mg | ORAL_TABLET | Freq: Four times a day (QID) | ORAL | 0 refills | Status: DC | PRN

## 2023-01-03 MED ORDER — TRAMADOL HCL 50 MG PO TABS: 50.0000 mg | ORAL_TABLET | Freq: Four times a day (QID) | ORAL | 0 refills | Status: DC | PRN

## 2023-01-03 MED ORDER — ONDANSETRON HCL 4 MG PO TABS: 4.0000 mg | ORAL_TABLET | Freq: Four times a day (QID) | ORAL | 0 refills | Status: DC | PRN

## 2023-01-03 MED ORDER — GABAPENTIN 300 MG PO CAPS
300.0000 mg | ORAL_CAPSULE | ORAL | 0 refills | Status: DC
  Filled 2023-01-03: qty 84, fill #0

## 2023-01-03 MED ORDER — ONDANSETRON HCL 4 MG PO TABS
4.0000 mg | ORAL_TABLET | Freq: Four times a day (QID) | ORAL | 0 refills | Status: DC | PRN
  Filled 2023-01-03: qty 20, 5d supply, fill #0

## 2023-01-03 MED ORDER — METHOCARBAMOL 500 MG PO TABS: 500.0000 mg | ORAL_TABLET | Freq: Four times a day (QID) | ORAL | 0 refills | Status: DC | PRN

## 2023-01-03 MED ORDER — GABAPENTIN 300 MG PO CAPS: 300.0000 mg | ORAL_CAPSULE | ORAL | 0 refills | Status: DC

## 2023-01-03 MED ORDER — OXYCODONE HCL 5 MG PO TABS
5.0000 mg | ORAL_TABLET | Freq: Four times a day (QID) | ORAL | 0 refills | Status: DC | PRN
  Filled 2023-01-03: qty 42, 6d supply, fill #0

## 2023-01-03 MED ORDER — METHOCARBAMOL 500 MG PO TABS
500.0000 mg | ORAL_TABLET | Freq: Four times a day (QID) | ORAL | 0 refills | Status: DC | PRN
  Filled 2023-01-03: qty 40, 10d supply, fill #0

## 2023-01-03 MED ORDER — ASPIRIN 81 MG PO CHEW
81.0000 mg | CHEWABLE_TABLET | Freq: Two times a day (BID) | ORAL | 0 refills | Status: DC
  Filled 2023-01-03: qty 40, 20d supply, fill #0

## 2023-01-03 MED ORDER — ACETAMINOPHEN 500 MG PO TABS
1000.0000 mg | ORAL_TABLET | Freq: Four times a day (QID) | ORAL | Status: DC
  Administered 2023-01-03 (×2): 1000 mg via ORAL
  Filled 2023-01-03 (×2): qty 2

## 2023-01-03 MED ORDER — ASPIRIN 81 MG PO CHEW: 81.0000 mg | CHEWABLE_TABLET | Freq: Two times a day (BID) | ORAL | 0 refills | Status: AC

## 2023-01-03 MED ORDER — TRAMADOL HCL 50 MG PO TABS
50.0000 mg | ORAL_TABLET | Freq: Four times a day (QID) | ORAL | 0 refills | Status: DC | PRN
  Filled 2023-01-03: qty 40, 5d supply, fill #0

## 2023-01-03 NOTE — Progress Notes (Addendum)
   Subjective: 1 Day Post-Op Procedure(s) (LRB): Right total knee arthroplasty femoral revision (Right) Patient seen in rounds by Dr. Lequita Halt. Patient is well, and has had no acute complaints or problems. Denies SOB or chest pain. Denies calf pain. Foley cath removed this AM. Patient reports pain as moderate.  Objective: Vital signs in last 24 hours: Temp:  [97.5 F (36.4 C)-98.3 F (36.8 C)] 98.1 F (36.7 C) (11/07 0604) Pulse Rate:  [53-71] 63 (11/07 0604) Resp:  [11-19] 17 (11/07 0604) BP: (117-168)/(79-103) 128/81 (11/07 0604) SpO2:  [98 %-100 %] 98 % (11/07 0604) Weight:  [61.7 kg] 61.7 kg (11/06 1417)  Intake/Output from previous day:  Intake/Output Summary (Last 24 hours) at 01/03/2023 0757 Last data filed at 01/03/2023 0755 Gross per 24 hour  Intake 2823.03 ml  Output 780 ml  Net 2043.03 ml     Intake/Output this shift: Total I/O In: 143.8 [I.V.:143.8] Out: -   Labs: Recent Labs    01/03/23 0334  HGB 11.1*   Recent Labs    01/03/23 0334  WBC 9.7  RBC 3.58*  HCT 35.3*  PLT 214   Recent Labs    01/03/23 0334  NA 137  K 3.4*  CL 105  CO2 22  BUN 12  CREATININE 0.64  GLUCOSE 156*  CALCIUM 8.7*   No results for input(s): "LABPT", "INR" in the last 72 hours.  Exam: General - Patient is Alert and Oriented Extremity - Neurologically intact Neurovascular intact Sensation intact distally Dorsiflexion/Plantar flexion intact Dressing - dressing C/D/I Motor Function - intact, moving foot and toes well on exam.  Past Medical History:  Diagnosis Date   Arthritis    GERD (gastroesophageal reflux disease)    Headache    Hypertension 2019    Assessment/Plan: 1 Day Post-Op Procedure(s) (LRB): Right total knee arthroplasty femoral revision (Right) Principal Problem:   Failed total knee arthroplasty (HCC) Active Problems:   Osteoarthritis of right knee  Estimated body mass index is 24.09 kg/m as calculated from the following:   Height as of  this encounter: 5\' 3"  (1.6 m).   Weight as of this encounter: 61.7 kg. Advance diet Up with therapy D/C IV fluids  Anticipated LOS equal to or greater than 2 midnights due to - Age 78 and older with one or more of the following:  - Obesity  - Expected need for hospital services (PT, OT, Nursing) required for safe  discharge  - Anticipated need for postoperative skilled nursing care or inpatient rehab  - Active co-morbidities: None OR   - Unanticipated findings during/Post Surgery: Slow post-op progression: GI, pain control, mobility  - Patient is a high risk of re-admission due to: None   DVT Prophylaxis - Aspirin Weight bearing as tolerated.  Start physical therapy. Possible discharge home today pending progress and if meeting patient goals. Scheduled for OPP[facility] at [facility] in [place]. Follow-up in clinic in 2 weeks.  The PDMP database was reviewed today prior to any opioid medications being prescribed to this patient.  R. Arcola Jansky, PA-C Orthopedic Surgery 01/03/2023, 7:57 AM

## 2023-01-03 NOTE — TOC Transition Note (Signed)
Transition of Care [facility]) - CM/SW Discharge Note  Patient Details  Name: Nicole Charles MRN: [id number] Date of Birth: March 24, 1975  [facility] CM/SW Contact:  Ewing Schlein, LCSW Phone Number: 01/03/2023, 11:22 AM  Clinical Narrative: Patient is expected to discharge home after working with PT. CSW spoke with patient to confirm discharge plan. Patient will go home with OPPT at [facility]. Patient has the required DME at home as this is not patient's first ortho surgery. TOC signing off.  Final next level of care: OP Rehab Barriers to Discharge: No Barriers Identified  Patient Goals and CMS Choice Choice offered to / list presented to : NA  Discharge Plan and Services Additional resources added to the After Visit Summary for          DME Arranged: N/A DME Agency: NA   Social Determinants of Health (SDOH) Interventions SDOH Screenings   Food Insecurity: No Food Insecurity (01/02/2023)  Housing: Low Risk  (01/02/2023)  Transportation Needs: No Transportation Needs (01/02/2023)  Utilities: Not At Risk (01/02/2023)  Tobacco Use: Low Risk  (01/02/2023)   Readmission Risk Interventions     No data to display

## 2023-01-03 NOTE — Plan of Care (Signed)
Patient discharging home via private vehicle. Haydee Salter, RN 01/03/23 2:43 PM

## 2023-01-03 NOTE — Evaluation (Signed)
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  Clinical Impression  Pt is s/p TKA revision resulting in the deficits listed below (see PT Problem List).  Pt will benefit from acute skilled PT to increase their independence and safety with mobility to allow discharge.  Pt mobilizing well with RW however requesting to trial crutches (reports smaller doorways in home).  Will return for crutch and stair training.  Pt anticipates d/c home today.          If plan is discharge home, recommend the following:     Can travel by private vehicle        Equipment Recommendations Other (comment) (pt requesting to trial crutches, TBA)  Recommendations for Other Services       Functional Status Assessment Patient has had a recent decline in their functional status and demonstrates the ability to make significant improvements in function in a reasonable and predictable amount of time.     Precautions / Restrictions Precautions Precautions: Fall;Knee Restrictions Weight Bearing Restrictions: No RLE Weight Bearing: Weight bearing as tolerated      Mobility  Bed Mobility Overal bed mobility: Needs Assistance Bed Mobility: Supine to Sit, Sit to Supine     Supine to sit: Supervision Sit to supine: Supervision        Transfers Overall transfer level: Needs assistance Equipment used: Rolling walker (2 wheels) Transfers: Sit to/from Stand Sit to Stand: Contact guard assist           General transfer comment: verbal cues for UE and LE positioning    Ambulation/Gait Ambulation/Gait assistance: Contact guard assist Gait Distance (Feet): 140 Feet Assistive device: Rolling walker (2 wheels) Gait  Pattern/deviations: Step-to pattern, Decreased stance time - right, Knee flexed in stance - right       General Gait Details: verbal cues for sequence, RW positioning, heel strike  Stairs            Wheelchair Mobility     Tilt Bed    Modified Rankin (Stroke Patients Only)       Balance                                             Pertinent Vitals/Pain Pain Assessment Pain Assessment: 0-10 Pain Score: 4  Pain Location: right knee Pain Descriptors / Indicators: Aching, Sore Pain Intervention(s): Monitored during session, Repositioned    Home Living Family/patient expects to be discharged to:: Private residence Living Arrangements: Spouse/significant other     Home Access: Stairs to enter Entrance Stairs-Rails: Right Entrance Stairs-Number of Steps: flight   Home Layout: Able to live on main level with bedroom/bathroom Home Equipment: Agricultural consultant (2 wheels)      Prior Function Prior Level of Function : Independent/Modified Independent                     Extremity/Trunk Assessment        Lower Extremity Assessment Lower Extremity Assessment: RLE deficits/detail RLE Deficits / Details: right knee AROM 10-45*, able to perform SLR       Communication   Communication Communication: No apparent difficulties  Cognition Arousal: Alert  Behavior During Therapy: WFL for tasks assessed/performed Overall Cognitive Status: Within Functional Limits for tasks assessed                                          General Comments      Exercises Total Joint Exercises Ankle Circles/Pumps: AROM, Both, 10 reps Quad Sets: AROM, Both, 10 reps Heel Slides: AAROM, Right, 10 reps Hip ABduction/ADduction: AAROM, Right, 10 reps Straight Leg Raises: AAROM, Right, 10 reps Knee Flexion: AROM, Right, 10 reps, Seated   Assessment/Plan    PT Assessment Patient needs continued PT services  PT Problem List Decreased  strength;Decreased mobility;Decreased range of motion;Pain;Decreased knowledge of use of DME       PT Treatment Interventions Stair training;Gait training;DME instruction;Balance training;Functional mobility training;Therapeutic activities;Therapeutic exercise;Patient/family education    PT Goals (Current goals can be found in the Care Plan section)  Acute Rehab PT Goals PT Goal Formulation: With patient Time For Goal Achievement: 01/10/23 Potential to Achieve Goals: Good    Frequency 7X/week     Co-evaluation               AM-PAC PT "6 Clicks" Mobility  Outcome Measure Help needed turning from your back to your side while in a flat bed without using bedrails?: None Help needed moving from lying on your back to sitting on the side of a flat bed without using bedrails?: None Help needed moving to and from a bed to a chair (including a wheelchair)?: A Little Help needed standing up from a chair using your arms (e.g., wheelchair or bedside chair)?: A Little Help needed to walk in hospital room?: A Little Help needed climbing 3-5 steps with a railing? : A Little 6 Click Score: 20    End of Session Equipment Utilized During Treatment: Gait belt Activity Tolerance: Patient tolerated treatment well Patient left: with call bell/phone within reach;in bed;with family/visitor present   PT Visit Diagnosis: Difficulty in walking, not elsewhere classified (R26.2)    Time: 3295-1884 PT Time Calculation (min) (ACUTE ONLY): 14 min   Charges:   PT Evaluation $PT Eval Low Complexity: 1 Low   PT General Charges $$ ACUTE PT VISIT: 1 Visit       Paulino Door, DPT Physical Therapist Acute Rehabilitation Services Office: [phone]   Janan Halter Payson 01/03/2023, 1:14 PM

## 2023-01-03 NOTE — Progress Notes (Signed)
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    PT Comments  Pt able to ambulate well with crutch and perform stair negotiation.  Pt had no further questions and feels ready for d/c home today.  Pt provided with HEP handout and plans to have OPPT on Monday.    If plan is discharge home, recommend the following:     Can travel by private vehicle        Equipment Recommendations  Crutches (called ortho tech to provide)    Recommendations for Other Services       Precautions / Restrictions Precautions Precautions: Fall;Knee Restrictions RLE Weight Bearing: Weight bearing as tolerated     Mobility  Bed Mobility Overal bed mobility: Modified Independent Bed Mobility: Supine to Sit, Sit to Supine     Supine to sit: Supervision Sit to supine: Supervision        Transfers Overall transfer level: Needs assistance Equipment used: Crutches Transfers: Sit to/from Stand Sit to Stand: Contact guard assist, Supervision           General transfer comment: verbal cues for crutch positioning    Ambulation/Gait Ambulation/Gait assistance: Supervision Gait Distance (Feet): 240 Feet Assistive device: Crutches Gait Pattern/deviations: Step-through pattern       General Gait Details: verbal cues for sequence, improved knee flexion this afternoon, good heel strike, steady with crutches   Stairs Stairs: Yes Stairs assistance: Contact guard assist Stair Management: Step to pattern, Forwards, One rail Right, With crutches Number of Stairs: 3 General stair comments: verbal cues for sequence and safety; pt performed well and had no further questions   Wheelchair Mobility     Tilt Bed    Modified  Rankin (Stroke Patients Only)       Balance                                            Cognition Arousal: Alert Behavior During Therapy: WFL for tasks assessed/performed Overall Cognitive Status: Within Functional Limits for tasks assessed                                          Exercises    General Comments        Pertinent Vitals/Pain Pain Assessment Pain Assessment: 0-10 Pain Score: 4  Pain Location: right knee Pain Descriptors / Indicators: Aching, Sore Pain Intervention(s): Monitored during session, Repositioned    Home Living Family/patient expects to be discharged to:: Private residence Living Arrangements: Spouse/significant other     Home Access: Stairs to enter Entrance Stairs-Rails: Right Entrance Stairs-Number of Steps: flight   Home Layout: Able to live on main level with bedroom/bathroom Home Equipment: Agricultural consultant (2 wheels)      Prior Function            PT Goals (current goals can now be found in the care plan section) Acute Rehab PT Goals PT Goal Formulation: With patient Time For Goal Achievement: 01/10/23 Potential to Achieve Goals: Good Progress towards PT goals: Progressing toward goals    Frequency  7X/week      PT Plan      Co-evaluation              AM-PAC PT "6 Clicks" Mobility   Outcome Measure  Help needed turning from your back to your side while in a flat bed without using bedrails?: None Help needed moving from lying on your back to sitting on the side of a flat bed without using bedrails?: None Help needed moving to and from a bed to a chair (including a wheelchair)?: A Little Help needed standing up from a chair using your arms (e.g., wheelchair or bedside chair)?: A Little Help needed to walk in hospital room?: A Little Help needed climbing 3-5 steps with a railing? : A Little 6 Click Score: 20    End of Session Equipment Utilized During Treatment: Gait  belt Activity Tolerance: Patient tolerated treatment well Patient left: in bed;with call bell/phone within reach;with family/visitor present Nurse Communication: Mobility status PT Visit Diagnosis: Difficulty in walking, not elsewhere classified (R26.2)     Time: 1350-1403 PT Time Calculation (min) (ACUTE ONLY): 13 min  Charges:    $Gait Training: 8-22 mins PT General Charges $$ ACUTE PT VISIT: 1 Visit                     Paulino Door, DPT Physical Therapist Acute Rehabilitation Services Office: [phone]    Janan Halter Payson 01/03/2023, 4:24 PM

## 2023-01-04 IMAGING — MG MM DIGITAL SCREENING BILAT W/ TOMO AND CAD
8 series · 8 of 24 positions shown · non-contrast
Comparison: None.

CLINICAL DATA: Screening.

EXAM:
DIGITAL SCREENING BILATERAL MAMMOGRAM WITH TOMOSYNTHESIS AND CAD
TECHNIQUE: Bilateral screening digital craniocaudal and mediolateral oblique
mammograms were obtained. Bilateral screening digital breast
tomosynthesis was performed. The images were evaluated with
computer-aided detection.

[L MLO synth-2D]
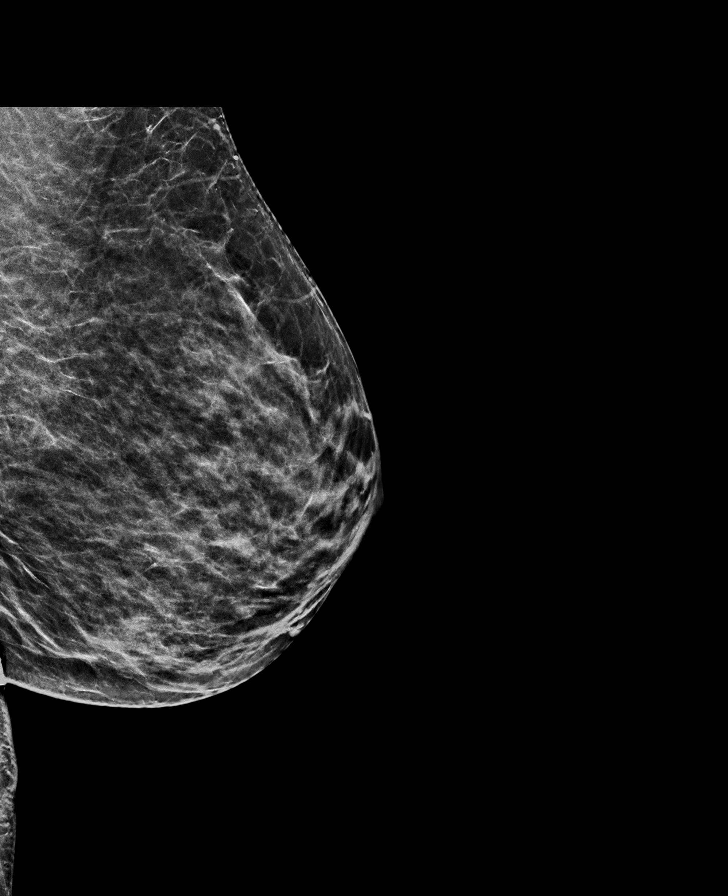

[L CC synth-2D]
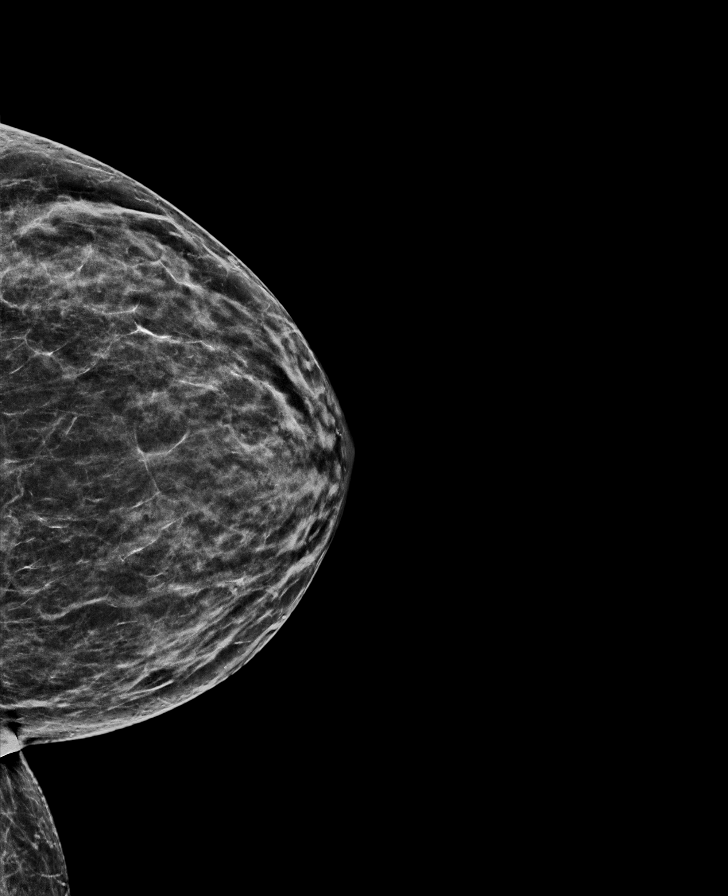

[R MLO synth-2D]
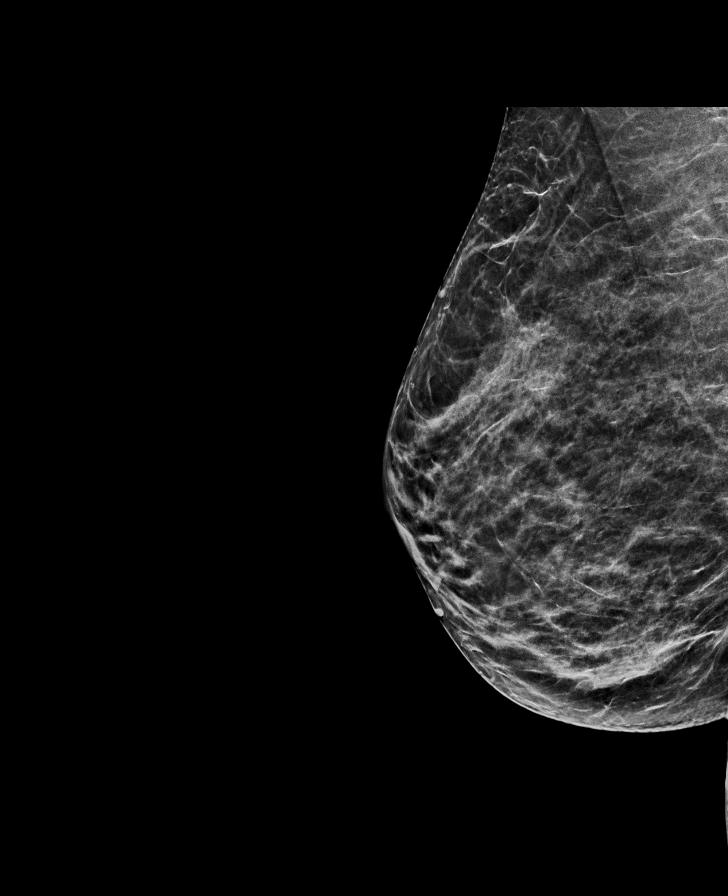

[R CC synth-2D]
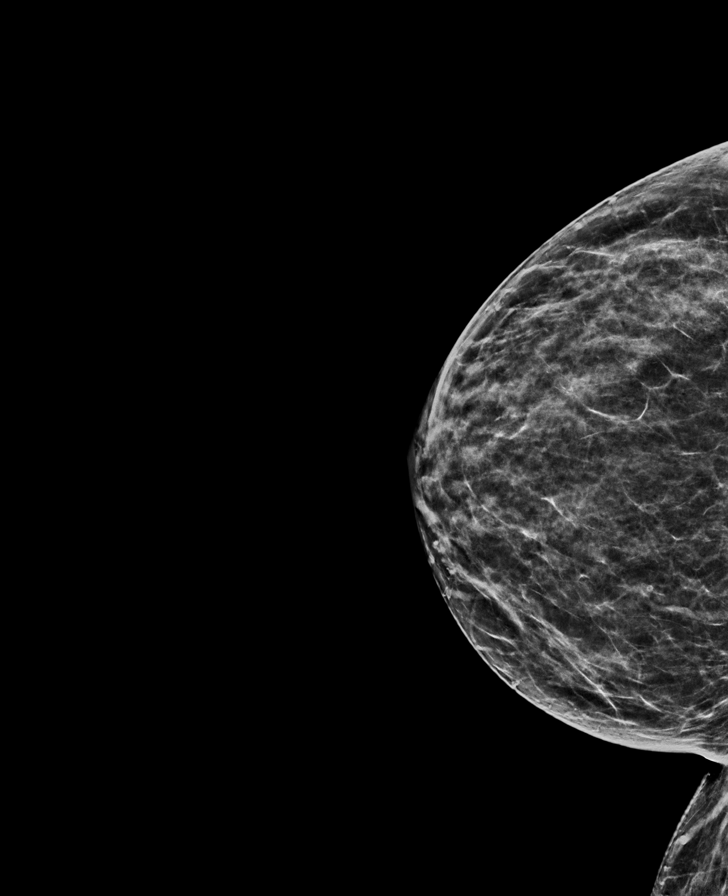

[L MLO tomo · tomo slice 31/60.0]
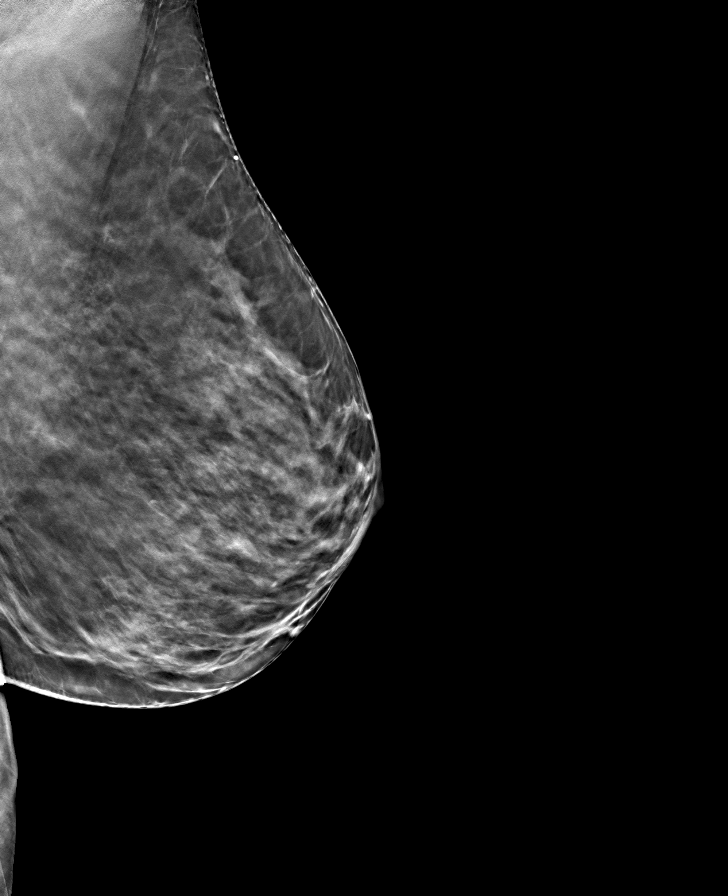

[R CC tomo · tomo slice 29/58.0]
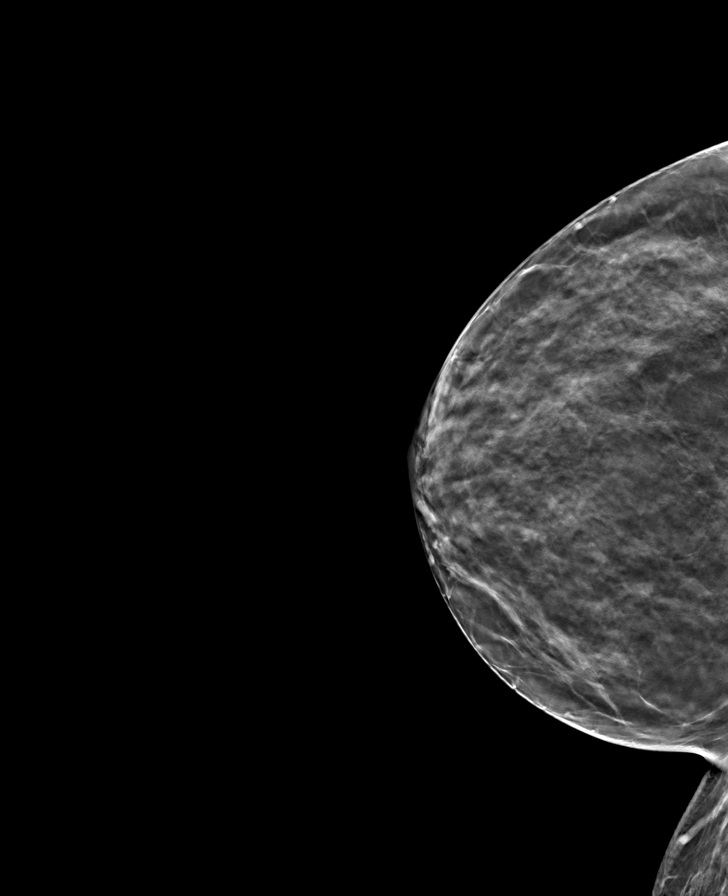

[R MLO tomo · tomo slice 31/60.0]
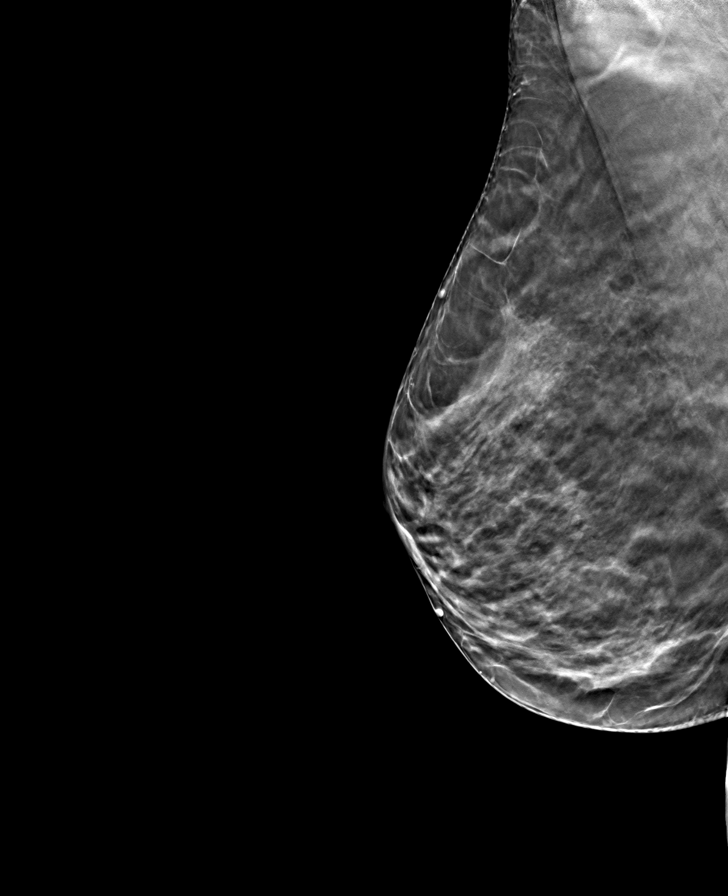

[L CC tomo · tomo slice 29/58.0]
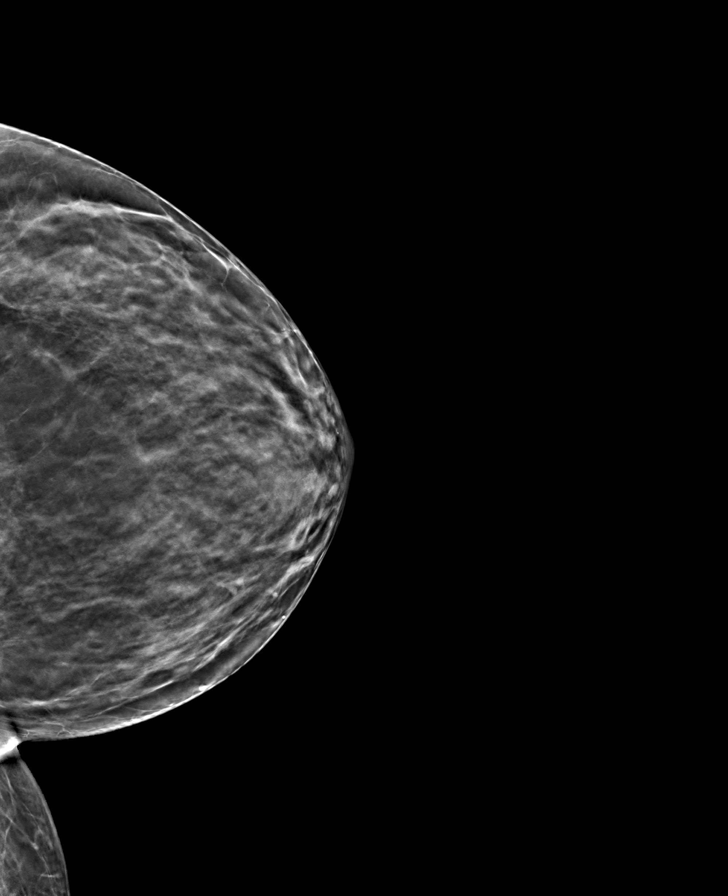

[8 of 24 positions shown; findings below may reference images not displayed]

ACR Breast Density Category c: The breast tissue is heterogeneously
dense, which may obscure small masses
FINDINGS: There are no findings suspicious for malignancy.
IMPRESSION: No mammographic evidence of malignancy. A result letter of this
screening mammogram will be mailed directly to the patient.

RECOMMENDATION:
Screening mammogram in one year. (Code:[id number]-T-[id number])

BI-RADS CATEGORY  1: Negative.

## 2023-01-05 NOTE — Anesthesia Postprocedure Evaluation (Signed)
Anesthesia Post Note  Patient: Nicole Charles  Procedure(s) Performed: Right total knee arthroplasty femoral revision (Right: Knee)     Patient location during evaluation: PACU Anesthesia Type: Spinal Level of consciousness: awake and alert Pain management: pain level controlled Vital Signs Assessment: post-procedure vital signs reviewed and stable Respiratory status: spontaneous breathing and respiratory function stable Cardiovascular status: blood pressure returned to baseline and stable Postop Assessment: spinal receding and no apparent nausea or vomiting Anesthetic complications: no   No notable events documented.  Last Vitals:  Vitals:   01/03/23 0604 01/03/23 0924  BP: 128/81 (!) 147/86  Pulse: 63 (!) 59  Resp: 17 18  Temp: 36.7 C 36.6 C  SpO2: 98% 100%    Last Pain:  Vitals:   01/03/23 1350  TempSrc:   PainSc: 5    Pain Goal: Patients Stated Pain Goal: 5 (01/03/23 1350)                 Beryle Lathe

## 2023-01-06 NOTE — Discharge Summary (Signed)
Physician Discharge Summary   Patient ID: Nicole Charles MRN: [id number] DOB/AGE: Oct 13, 1975 47 y.o.  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  Admission Diagnoses:  Past Medical History:  Diagnosis Date   Arthritis    GERD (gastroesophageal reflux disease)    Headache    Hypertension 2019   Discharge Diagnoses:   Principal Problem:   Failed total knee arthroplasty (HCC) Active Problems:   Osteoarthritis of right knee  Estimated body mass index is 24.09 kg/m as calculated from the following:   Height as of this encounter: 5\' 3"  (1.6 m).   Weight as of this encounter: 61.7 kg.  Procedure:  Procedure(s) (LRB): Right total knee arthroplasty femoral revision (Right)   Consults: None  HPI: The patient is a 47 year old female who had a right total knee arthroplasty performed a little over a year ago.  She has had difficulty obtaining range of motion, especially getting her extension.  She has had extensive physical therapy.  She had a closed manipulation for flexion and ended up getting about 115 degrees of flexion.  She had about a 20 to 25-degree flexion contracture, which was refractory to all means of treatment including bracing and physical therapy.  She presents now for femoral revision to increase the extension gap and allow for full extension.  Laboratory Data: Admission on 01/02/2023, Discharged on 01/03/2023  Component Date Value Ref Range Status   ABO/RH(D) 01/02/2023    Final                   Value:B POS Performed at [facility], [address], [place], [place] [address]    Preg Test, Ur 01/02/2023 NEGATIVE  NEGATIVE Final   Comment:        THE SENSITIVITY OF THIS METHODOLOGY IS >24 mIU/mL    WBC 01/03/2023 9.7  4.0 - 10.5 K/uL Final   RBC 01/03/2023 3.58 (L)  3.87 - 5.11 MIL/uL Final   Hemoglobin 01/03/2023 11.1 (L)  12.0 - 15.0 g/dL  Final   HCT 95/63/8756 35.3 (L)  36.0 - 46.0 % Final   MCV 01/03/2023 98.6  80.0 - 100.0 fL Final   MCH 01/03/2023 31.0  26.0 - 34.0 pg Final   MCHC 01/03/2023 31.4  30.0 - 36.0 g/dL Final   RDW 43/32/9518 13.2  11.5 - 15.5 % Final   Platelets 01/03/2023 214  150 - 400 K/uL Final   nRBC 01/03/2023 0.0  0.0 - 0.2 % Final   Performed at [facility], [address], [place], [place] [address]   Sodium 01/03/2023 137  135 - 145 mmol/L Final   Potassium 01/03/2023 3.4 (L)  3.5 - 5.1 mmol/L Final   Chloride 01/03/2023 105  98 - 111 mmol/L Final   CO2 01/03/2023 22  22 - 32 mmol/L Final   Glucose, Bld 01/03/2023 156 (H)  70 - 99 mg/dL Final   Glucose reference range applies only to samples taken after fasting for at least 8 hours.   BUN 01/03/2023 12  6 - 20 mg/dL Final   Creatinine, Ser 01/03/2023 0.64  0.44 - 1.00 mg/dL Final   Calcium 08/26/1599 8.7 (L)  8.9 - 10.3 mg/dL Final   GFR, Estimated 01/03/2023 >60  >60 mL/min Final   Comment: (NOTE) Calculated using the CKD-EPI Creatinine Equation (2021)    Anion gap 01/03/2023 10  5 - 15 Final   Performed at [facility], [address], [place], [place] [address]  Hospital Outpatient Visit on 12/20/2022  Component Date Value Ref Range Status   MRSA, PCR 12/20/2022 NEGATIVE  NEGATIVE Final   Staphylococcus aureus 12/20/2022 NEGATIVE  NEGATIVE Final   Comment: (NOTE) The Xpert SA Assay (FDA approved for NASAL specimens in patients 64 years of age and older), is one component of a comprehensive surveillance program. It is not intended to diagnose infection nor to guide or monitor treatment. Performed at [facility], [address], [place], [place] [address]    Sodium 12/20/2022 139  135 - 145 mmol/L Final   Potassium 12/20/2022 3.2 (L)  3.5 - 5.1 mmol/L Final   Chloride 12/20/2022 104  98 - 111 mmol/L Final   CO2 12/20/2022 27  22 - 32 mmol/L Final   Glucose, Bld 12/20/2022 93   70 - 99 mg/dL Final   Glucose reference range applies only to samples taken after fasting for at least 8 hours.   BUN 12/20/2022 15  6 - 20 mg/dL Final   Creatinine, Ser 12/20/2022 0.83  0.44 - 1.00 mg/dL Final   Calcium 28/41/3244 9.1  8.9 - 10.3 mg/dL Final   GFR, Estimated 12/20/2022 >60  >60 mL/min Final   Comment: (NOTE) Calculated using the CKD-EPI Creatinine Equation (2021)    Anion gap 12/20/2022 8  5 - 15 Final   Performed at [facility], [address] [place], [place] [address]   WBC 12/20/2022 3.1 (L)  4.0 - 10.5 K/uL Final   RBC 12/20/2022 3.97  3.87 - 5.11 MIL/uL Final   Hemoglobin 12/20/2022 12.3  12.0 - 15.0 g/dL Final   HCT 25/36/6440 37.3  36.0 - 46.0 % Final   MCV 12/20/2022 94.0  80.0 - 100.0 fL Final   MCH 12/20/2022 31.0  26.0 - 34.0 pg Final   MCHC 12/20/2022 33.0  30.0 - 36.0 g/dL Final   RDW 34/74/2595 12.9  11.5 - 15.5 % Final   Platelets 12/20/2022 214  150 - 400 K/uL Final   nRBC 12/20/2022 0.0  0.0 - 0.2 % Final   Performed at [facility], [address], [place], [place] [address]   ABO/RH(D) 12/20/2022 B POS   Final   Antibody Screen 12/20/2022 NEG   Final   Sample Expiration 12/20/2022 01/03/2023,2359   Final   Extend sample reason 12/20/2022    Final                   Value:NO TRANSFUSIONS OR PREGNANCY IN THE PAST 3 MONTHS Performed at [facility], [address], [place], [place] [address]      X-Rays:No results found.  EKG: Orders placed or performed during the hospital encounter of 12/20/22   EKG 12 lead per protocol   EKG 12 lead per protocol     Hospital Course: Nicole Charles is a 47 y.o. who was admitted to [facility]. They were brought to the operating room on 01/02/2023 and underwent Procedure(s): Right total knee arthroplasty femoral revision.  Patient tolerated the procedure well and was later transferred to the recovery room and then to the orthopaedic floor for  postoperative care. They were given PO and IV analgesics for pain control following their surgery. They were given 24 hours of postoperative antibiotics of  Anti-infectives (From admission, onward)    Start     Dose/Rate Route Frequency Ordered Stop   01/03/23 0600  ceFAZolin (ANCEF) IVPB 2g/100 mL  premix        2 g 200 mL/hr over 30 Minutes Intravenous On call to O.R. 01/02/23 1350 01/02/23 1636   01/02/23 2215  ceFAZolin (ANCEF) IVPB 2g/100 mL premix        2 g 200 mL/hr over 30 Minutes Intravenous Every 6 hours 01/02/23 2006 01/03/23 0701      and started on DVT prophylaxis in the form of Aspirin.   PT and OT were ordered for total joint protocol. Discharge planning consulted to help with postop disposition and equipment needs.  Patient had a fair night on the evening of surgery. She was seen during rounds on POD #1 and was ready to go home pending progress with therapy. She worked with therapy and was meeting her goals. Pt was discharged to home later that day in stable condition.  Diet: Regular diet Activity: WBAT Follow-up: in 2 weeks Disposition: Home Discharged Condition: stable   Discharge Instructions     Call MD / Call 911   Complete by: As directed    If you experience chest pain or shortness of breath, CALL 911 and be transported to the hospital emergency room.  If you develope a fever above 101 F, pus (white drainage) or increased drainage or redness at the wound, or calf pain, call your surgeon's office.   Change dressing   Complete by: As directed    You may remove the bulky bandage (ACE wrap and gauze) two days after surgery. You will have an adhesive waterproof bandage underneath. Leave this in place until your first follow-up appointment.   Constipation Prevention   Complete by: As directed    Drink plenty of fluids.  Prune juice may be helpful.  You may use a stool softener, such as Colace (over the counter) 100 mg twice a day.  Use MiraLax (over the counter) for  constipation as needed.   Diet - low sodium heart healthy   Complete by: As directed    Do not put a pillow under the knee. Place it under the heel.   Complete by: As directed    Driving restrictions   Complete by: As directed    No driving for two weeks   Post-operative opioid taper instructions:   Complete by: As directed    POST-OPERATIVE OPIOID TAPER INSTRUCTIONS: It is important to wean off of your opioid medication as soon as possible. If you do not need pain medication after your surgery it is ok to stop day one. Opioids include: Codeine, Hydrocodone(Norco, Vicodin), Oxycodone(Percocet, oxycontin) and hydromorphone amongst others.  Long term and even short term use of opiods can cause: Increased pain response Dependence Constipation Depression Respiratory depression And more.  Withdrawal symptoms can include Flu like symptoms Nausea, vomiting And more Techniques to manage these symptoms Hydrate well Eat regular healthy meals Stay active Use relaxation techniques(deep breathing, meditating, yoga) Do Not substitute Alcohol to help with tapering If you have been on opioids for less than two weeks and do not have pain than it is ok to stop all together.  Plan to wean off of opioids This plan should start within one week post op of your joint replacement. Maintain the same interval or time between taking each dose and first decrease the dose.  Cut the total daily intake of opioids by one tablet each day Next start to increase the time between doses. The last dose that should be eliminated is the evening dose.      TED hose   Complete  by: As directed    Use stockings (TED hose) for three weeks on both leg(s).  You may remove them at night for sleeping.   Weight bearing as tolerated   Complete by: As directed       Allergies as of 01/03/2023       Reactions   Lisinopril Other (See Comments)   Headache        Medication List     TAKE these medications     amLODipine 2.5 MG tablet Commonly known as: NORVASC Take 2.5 mg by mouth daily.   aspirin 81 MG chewable tablet Chew 1 tablet (81 mg total) by mouth 2 (two) times daily for 20 days. Then take one 81 mg aspirin once a day for three weeks. Then discontinue aspirin.   busPIRone 5 MG tablet Commonly known as: BUSPAR Take 5 mg by mouth daily.   carvedilol 12.5 MG tablet Commonly known as: COREG Take 12.5 mg by mouth 2 (two) times daily with a meal.   diphenhydramine-acetaminophen 25-500 MG Tabs tablet Commonly known as: TYLENOL PM Take 1 tablet by mouth at bedtime as needed (sleep).   gabapentin 300 MG capsule Commonly known as: NEURONTIN Take 1 capsule (300 mg total) by mouth as directed. Take a 300 mg capsule three times a day for two weeks following surgery.Then take a 300 mg capsule two times a day for two weeks. Then take a 300 mg capsule once a day for two weeks. Then discontinue.   hydrochlorothiazide 25 MG tablet Commonly known as: HYDRODIURIL Take 25 mg by mouth daily.   methocarbamol 500 MG tablet Commonly known as: ROBAXIN Take 1 tablet (500 mg total) by mouth every 6 (six) hours as needed for muscle spasms.   ondansetron 4 MG tablet Commonly known as: ZOFRAN Take 1 tablet (4 mg total) by mouth every 6 (six) hours as needed for nausea.   oxyCODONE 5 MG immediate release tablet Commonly known as: Oxy IR/ROXICODONE Take 1-2 tablets (5-10 mg total) by mouth every 6 (six) hours as needed for severe pain (pain score 7-10).   pantoprazole 40 MG tablet Commonly known as: PROTONIX Take 40 mg by mouth daily.   traMADol 50 MG tablet Commonly known as: ULTRAM Take 1-2 tablets (50-100 mg total) by mouth every 6 (six) hours as needed for moderate pain (pain score 4-6).               Discharge Care Instructions  (From admission, onward)           Start     Ordered   01/03/23 0000  Weight bearing as tolerated        01/03/23 0755   01/03/23 0000  Change  dressing       Comments: You may remove the bulky bandage (ACE wrap and gauze) two days after surgery. You will have an adhesive waterproof bandage underneath. Leave this in place until your first follow-up appointment.   01/03/23 0755            Follow-up Information     Ollen Gross, MD. Schedule an appointment as soon as possible for a visit in 2 week(s).   Specialty: Orthopedic Surgery Contact information: [address] [place] [address] [phone]                 Signed: R. Arcola Jansky, PA-C Orthopedic Surgery 01/06/2023, 11:33 AM

## 2023-05-28 NOTE — Progress Notes (Signed)
 Surgery orders requested via Epic inbox.

## 2023-05-29 NOTE — H&P (Signed)
 H&P  Subjective:  HPI: Nicole Charles, 48 y.o. female is status post right femoral revision surgery. She reports that she has been wearing the prescribed brace for the past two weeks. She notes that while the brace has helped her walk better, her knee still feels stiff. She describes the pain as tolerable and mentions that it is most intense when she sits down and starts moving again. The pain decreases slightly after some time. She has not had her knee manipulated under anesthesia since the surgery. She is currently undergoing therapy, which she believes will be more effective after the manipulation. She uses the brace three times a day for thirty minutes each session.  Patient Active Problem List   Diagnosis Date Noted   Failed total knee arthroplasty (HCC) 01/02/2023   Osteoarthritis of right knee 01/02/2023   Knee pain, right anterior 10/22/2018    Past Medical History:  Diagnosis Date   Arthritis    GERD (gastroesophageal reflux disease)    Headache    Hypertension 2019    Past Surgical History:  Procedure Laterality Date   HERNIA REPAIR  2007   umbilical   KNEE ARTHROSCOPY Right 10/22/2018   Procedure: Right knee arthroscopy; chondroplasty, Meniscal Debridemnt;  Surgeon: Ollen Gross, MD;  Location: [facility];  Service: Orthopedics;  Laterality: Right;    KNEE ARTHROSCOPY W/ DEBRIDEMENT     x3   TOTAL KNEE REVISION Right 01/02/2023   Procedure: Right total knee arthroplasty femoral revision;  Surgeon: Ollen Gross, MD;  Location: [facility];  Service: Orthopedics;  Laterality: Right;   TUBAL LIGATION      Prior to Admission medications   Medication Sig Start Date End Date Taking? Authorizing Provider  amLODipine (NORVASC) 2.5 MG tablet Take 2.5 mg by mouth daily.   Yes [provider]  busPIRone (BUSPAR) 5 MG tablet Take 5 mg by mouth daily.   Yes [provider]  carvedilol (COREG) 12.5 MG tablet Take 12.5 mg by mouth 2 (two) times daily with a meal.    Yes [provider]  diphenhydramine-acetaminophen (TYLENOL PM) 25-500 MG TABS tablet Take 1 tablet by mouth at bedtime as needed (sleep).   Yes [provider]  hydrochlorothiazide (HYDRODIURIL) 25 MG tablet Take 25 mg by mouth daily.   Yes [provider]  pantoprazole (PROTONIX) 40 MG tablet Take 40 mg by mouth daily. 10/09/22  Yes [provider]  gabapentin (NEURONTIN) 300 MG capsule Take 1 capsule (300 mg total) by mouth as directed. Take a 300 mg capsule three times a day for two weeks following surgery.Then take a 300 mg capsule two times a day for two weeks. Then take a 300 mg capsule once a day for two weeks. Then discontinue. Patient not taking: Reported on 05/27/2023 01/03/23   Eartha Inch, PA  methocarbamol (ROBAXIN) 500 MG tablet Take 1 tablet (500 mg total) by mouth every 6 (six) hours as needed for muscle spasms. Patient not taking: Reported on 05/27/2023 01/03/23   Eartha Inch, PA  ondansetron (ZOFRAN) 4 MG tablet Take 1 tablet (4 mg total) by mouth every 6 (six) hours as needed for nausea. Patient not taking: Reported on 05/27/2023 01/03/23   Eartha Inch, PA  oxyCODONE (OXY IR/ROXICODONE) 5 MG immediate release tablet Take 1-2 tablets (5-10 mg total) by mouth every 6 (six) hours as needed for severe pain (pain score 7-10). Patient not taking: Reported on 05/27/2023 01/03/23   Eartha Inch, PA  traMADol (ULTRAM) 50 MG tablet Take 1-2  tablets (50-100 mg total) by mouth every 6 (six) hours as needed for moderate pain (pain score 4-6). Patient not taking: Reported on 05/27/2023 01/03/23   Eartha Inch, PA    Allergies  Allergen Reactions   Lisinopril Other (See Comments)    Headache    Social History   Socioeconomic History   Marital status: Single    Spouse name: Not on file   Number of children: Not on file   Years of education: Not on file   Highest education level: Not on file  Occupational History   Not on file   Tobacco Use   Smoking status: Never   Smokeless tobacco: Never  Vaping Use   Vaping status: Never Used  Substance and Sexual Activity   Alcohol use: Not Currently    Comment: occ.   Drug use: Never   Sexual activity: Not on file  Other Topics Concern   Not on file  Social History Narrative   Not on file   Social Drivers of Health   Financial Resource Strain: Not on file  Food Insecurity: No Food Insecurity (01/02/2023)   Hunger Vital Sign    Worried About Running Out of Food in the Last Year: Never true    Ran Out of Food in the Last Year: Never true  Transportation Needs: No Transportation Needs (01/02/2023)   PRAPARE - Administrator, Civil Service (Medical): No    Lack of Transportation (Non-Medical): No  Physical Activity: Not on file  Stress: Not on file  Social Connections: Not on file  Intimate Partner Violence: Not At Risk (01/02/2023)   Humiliation, Afraid, Rape, and Kick questionnaire    Fear of Current or Ex-Partner: No    Emotionally Abused: No    Physically Abused: No    Sexually Abused: No    Tobacco Use: Low Risk  (01/02/2023)   Patient History    Smoking Tobacco Use: Never    Smokeless Tobacco Use: Never    Passive Exposure: Not on file   Social History   Substance and Sexual Activity  Alcohol Use Not Currently   Comment: occ.    No family history on file.  Review of Systems  Constitutional:  Negative for chills and fever.  HENT:  Negative for congestion, sore throat and tinnitus.   Eyes:  Negative for double vision, photophobia and pain.  Respiratory:  Negative for cough, shortness of breath and wheezing.   Cardiovascular:  Negative for chest pain, palpitations and orthopnea.  Gastrointestinal:  Negative for heartburn, nausea and vomiting.  Genitourinary:  Negative for dysuria, frequency and urgency.  Musculoskeletal:  Positive for joint pain.  Neurological:  Negative for dizziness, weakness and headaches.     Objective:  Physical Exam:   Well-developed female, alert, oriented, in no apparent distress. - Right knee shows no warmth or effusion. - Range of motion is 20-90 degrees with a firm endpoint on flexion. - There is a slight improvement in tissue springiness upon extension compared to previous visits.  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 She has been using a brace, which has provided some improvement in her condition. A closed manipulation of the right knee in flexion and extension is planned to improve her range of motion. This procedure was discussed in detail with the patient and she elects to proceed.    PLAN:  We will proceed with a closed manipulation of the right knee to improve  flexion and extension. We will restart physical therapy following the procedure.   Arther Abbott, PA-C Orthopedic Surgery [facility]

## 2023-05-29 NOTE — Patient Instructions (Signed)
 DUE TO COVID-19 ONLY TWO VISITORS  (aged 46 and older)  ARE ALLOWED TO COME WITH YOU AND STAY IN THE WAITING ROOM ONLY DURING PRE OP AND PROCEDURE.   **NO VISITORS ARE ALLOWED IN THE SHORT STAY AREA OR RECOVERY ROOM!!**  IF YOU WILL BE ADMITTED INTO THE HOSPITAL YOU ARE ALLOWED ONLY FOUR SUPPORT PEOPLE DURING VISITATION HOURS ONLY (7 AM -8PM)   The support person(s) must pass our screening, gel in and out, and wear a mask at all times, including in the patient's room. Patients must also wear a mask when staff or their support person are in the room. Visitors GUEST BADGE MUST BE WORN VISIBLY  One adult visitor may remain with you overnight and MUST be in the room by 8 P.M.     Your procedure is scheduled on: 06/03/23   Report to [facility] Main Entrance    Report to admitting at : 2:10 PM   Call this number if you have problems the morning of surgery [phone]   Do not eat food :After Midnight.   After Midnight you may have the following liquids until : 1:20 PM DAY OF SURGERY  Water Black Coffee (sugar ok, NO MILK/CREAM OR CREAMERS)  Tea (sugar ok, NO MILK/CREAM OR CREAMERS) regular and decaf                             Plain Jell-O (NO RED)                                           Fruit ices (not with fruit pulp, NO RED)                                     Popsicles (NO RED)                                                                  Juice: apple, WHITE grape, WHITE cranberry Sports drinks like Gatorade (NO RED)   The day of surgery:  Drink ONE (1) Pre-Surgery Clear Ensure at : 1:20 PM the morning of surgery. Drink in one sitting. Do not sip.  This drink was given to you during your hospital  pre-op appointment visit. Nothing else to drink after completing the  Pre-Surgery Clear Ensure or G2.          If you have questions, please contact your surgeon's office.  FOLLOW ANY ADDITIONAL PRE OP INSTRUCTIONS YOU RECEIVED FROM YOUR SURGEON'S OFFICE!!!   Oral  Hygiene is also important to reduce your risk of infection.                                    Remember - BRUSH YOUR TEETH THE MORNING OF SURGERY WITH YOUR REGULAR TOOTHPASTE  DENTURES WILL BE REMOVED PRIOR TO SURGERY PLEASE DO NOT APPLY "Poly grip" OR ADHESIVES!!!   Do NOT smoke after Midnight   Take these medicines the morning of surgery  with A SIP OF WATER: buspirone,carvedilol,amlodipine,pantoprazole.  STOP TAKING all Vitamins, Herbs and supplements 1 week before your surgery.                   You may not have any metal on your body including hair pins, jewelry, and body piercing             Do not wear make-up, lotions, powders, perfumes/cologne, or deodorant  Do not wear nail polish including gel and S&S, artificial/acrylic nails, or any other type of covering on natural nails including finger and toenails. If you have artificial nails, gel coating, etc. that needs to be removed by a nail salon please have this removed prior to surgery or surgery may need to be canceled/ delayed if the surgeon/ anesthesia feels like they are unable to be safely monitored.   Do not shave  48 hours prior to surgery.    Do not bring valuables to the hospital. [facility] IS NOT             RESPONSIBLE   FOR VALUABLES.   Contacts, glasses, or bridgework may not be worn into surgery.   Bring small overnight bag day of surgery.   DO NOT BRING YOUR HOME MEDICATIONS TO THE HOSPITAL. PHARMACY WILL DISPENSE MEDICATIONS LISTED ON YOUR MEDICATION LIST TO YOU DURING YOUR ADMISSION IN THE HOSPITAL!    Patients discharged on the day of surgery will not be allowed to drive home.  Someone NEEDS to stay with you for the first 24 hours after anesthesia.   Special Instructions: Bring a copy of your healthcare power of attorney and living will documents         the day of surgery if you haven't scanned them before.              Please read over the following fact sheets you were given: IF YOU HAVE QUESTIONS ABOUT  YOUR PRE-OP INSTRUCTIONS PLEASE CALL [phone]    [facility] - Preparing for Surgery Before surgery, you can play an important role.  Because skin is not sterile, your skin needs to be as free of germs as possible.  You can reduce the number of germs on your skin by washing with CHG (chlorahexidine gluconate) soap before surgery.  CHG is an antiseptic cleaner which kills germs and bonds with the skin to continue killing germs even after washing. Please DO NOT use if you have an allergy to CHG or antibacterial soaps.  If your skin becomes reddened/irritated stop using the CHG and inform your nurse when you arrive at Short Stay. Do not shave (including legs and underarms) for at least 48 hours prior to the first CHG shower.  You may shave your face/neck. Please follow these instructions carefully:  1.  Shower with CHG Soap the night before surgery and the  morning of Surgery.  2.  If you choose to wash your hair, wash your hair first as usual with your  normal  shampoo.  3.  After you shampoo, rinse your hair and body thoroughly to remove the  shampoo.                           4.  Use CHG as you would any other liquid soap.  You can apply chg directly  to the skin and wash  Gently with a scrungie or clean washcloth.  5.  Apply the CHG Soap to your body ONLY FROM THE NECK DOWN.   Do not use on face/ open                           Wound or open sores. Avoid contact with eyes, ears mouth and genitals (private parts).                       Wash face,  Genitals (private parts) with your normal soap.             6.  Wash thoroughly, paying special attention to the area where your surgery  will be performed.  7.  Thoroughly rinse your body with warm water from the neck down.  8.  DO NOT shower/wash with your normal soap after using and rinsing off  the CHG Soap.                9.  Pat yourself dry with a clean towel.            10.  Wear clean pajamas.            11.  Place clean  sheets on your bed the night of your first shower and do not  sleep with pets. Day of Surgery : Do not apply any lotions/deodorants the morning of surgery.  Please wear clean clothes to the hospital/surgery center.  FAILURE TO FOLLOW THESE INSTRUCTIONS MAY RESULT IN THE CANCELLATION OF YOUR SURGERY PATIENT SIGNATURE_________________________________  NURSE SIGNATURE__________________________________  ________________________________________________________________________

## 2023-05-30 ENCOUNTER — Encounter (HOSPITAL_COMMUNITY)
Admission: RE | Admit: 2023-05-30 | Discharge: 2023-05-30 | Disposition: A | Discharge status: Home / Self Care | Source: Ambulatory Visit | Attending: Orthopedic Surgery | Admitting: Orthopedic Surgery

## 2023-05-30 ENCOUNTER — Other Ambulatory Visit: Payer: Self-pay

## 2023-05-30 ENCOUNTER — Encounter (HOSPITAL_COMMUNITY): Payer: Self-pay

## 2023-05-30 DIAGNOSIS — Z01818 Encounter for other preprocedural examination: Secondary | ICD-10-CM | POA: Diagnosis present

## 2023-05-30 DIAGNOSIS — I10 Essential (primary) hypertension: Secondary | ICD-10-CM

## 2023-05-30 NOTE — Progress Notes (Signed)
 For Anesthesia: PCP - Lenoria Chime, FNP  Cardiologist - N/A  Bowel Prep reminder:  Chest x-ray -  EKG - 12/20/22 Stress Test -  ECHO -  Cardiac Cath -  Pacemaker/ICD device last checked: Pacemaker orders received: Device Rep notified:  Spinal Cord Stimulator:N/A  Sleep Study - N/A CPAP -   Fasting Blood Sugar - N/A Checks Blood Sugar _____ times a day Date and result of last Hgb A1c-  Last dose of GLP1 agonist- N/A GLP1 instructions:   Last dose of SGLT-2 inhibitors- N/A SGLT-2 instructions:   Blood Thinner Instructions:N/A Aspirin Instructions: Last Dose:  Activity level: Can go up a flight of stairs and activities of daily living without stopping and without chest pain and/or shortness of breath   Able to exercise without chest pain and/or shortness of breath    Anesthesia review:   Patient denies shortness of breath, fever, cough and chest pain at PAT appointment   Patient verbalized understanding of instructions that were given to them at the PAT appointment. Patient was also instructed that they will need to review over the PAT instructions again at home before surgery.

## 2023-06-03 ENCOUNTER — Ambulatory Visit (HOSPITAL_COMMUNITY): Admitting: Anesthesiology

## 2023-06-03 ENCOUNTER — Encounter (HOSPITAL_COMMUNITY)
Admission: RE | Disposition: A | Discharge status: Home / Self Care | Payer: Self-pay | Source: Ambulatory Visit | Attending: Orthopedic Surgery

## 2023-06-03 ENCOUNTER — Ambulatory Visit (HOSPITAL_COMMUNITY)
Admission: RE | Admit: 2023-06-03 | Discharge: 2023-06-03 | Disposition: A | Discharge status: Home / Self Care | Source: Ambulatory Visit | Attending: Orthopedic Surgery | Admitting: Orthopedic Surgery

## 2023-06-03 ENCOUNTER — Other Ambulatory Visit: Payer: Self-pay

## 2023-06-03 ENCOUNTER — Encounter (HOSPITAL_COMMUNITY): Payer: Self-pay | Admitting: Orthopedic Surgery

## 2023-06-03 DIAGNOSIS — Z01818 Encounter for other preprocedural examination: Secondary | ICD-10-CM

## 2023-06-03 DIAGNOSIS — M1990 Unspecified osteoarthritis, unspecified site: Secondary | ICD-10-CM | POA: Diagnosis not present

## 2023-06-03 DIAGNOSIS — M24669 Ankylosis, unspecified knee: Secondary | ICD-10-CM | POA: Diagnosis present

## 2023-06-03 DIAGNOSIS — M24661 Ankylosis, right knee: Secondary | ICD-10-CM | POA: Diagnosis present

## 2023-06-03 DIAGNOSIS — K219 Gastro-esophageal reflux disease without esophagitis: Secondary | ICD-10-CM | POA: Insufficient documentation

## 2023-06-03 DIAGNOSIS — I10 Essential (primary) hypertension: Secondary | ICD-10-CM | POA: Insufficient documentation

## 2023-06-03 HISTORY — PX: EXAM UNDER ANESTHESIA WITH MANIPULATION OF KNEE: SHX5816

## 2023-06-03 LAB — POCT PREGNANCY, URINE: Preg Test, Ur: NEGATIVE

## 2023-06-03 LAB — CBC
HCT: 36.9 % (ref 36.0–46.0)
Hemoglobin: 11.9 g/dL — ABNORMAL LOW (ref 12.0–15.0)
MCH: 29.7 pg (ref 26.0–34.0)
MCHC: 32.2 g/dL (ref 30.0–36.0)
MCV: 92 fL (ref 80.0–100.0)
Platelets: 257 10*3/uL (ref 150–400)
RBC: 4.01 MIL/uL (ref 3.87–5.11)
RDW: 13.7 % (ref 11.5–15.5)
WBC: 3.9 10*3/uL — ABNORMAL LOW (ref 4.0–10.5)
nRBC: 0 % (ref 0.0–0.2)

## 2023-06-03 LAB — BASIC METABOLIC PANEL WITH GFR
Anion gap: 7 (ref 5–15)
BUN: 11 mg/dL (ref 6–20)
CO2: 27 mmol/L (ref 22–32)
Calcium: 9.3 mg/dL (ref 8.9–10.3)
Chloride: 105 mmol/L (ref 98–111)
Creatinine, Ser: 0.75 mg/dL (ref 0.44–1.00)
GFR, Estimated: >60 mL/min (ref >60)
Glucose, Bld: 78 mg/dL (ref 70–99)
Potassium: 3.3 mmol/L — ABNORMAL LOW (ref 3.5–5.1)
Sodium: 139 mmol/L (ref 135–145)

## 2023-06-03 SURGERY — MANIPULATION, JOINT, KNEE, WITH ANESTHESIA
Anesthesia: General | Site: Knee | Laterality: Right

## 2023-06-03 MED ORDER — LACTATED RINGERS IV SOLN: INTRAVENOUS | Status: DC

## 2023-06-03 MED ORDER — MIDAZOLAM HCL 5 MG/5ML IJ SOLN
INTRAMUSCULAR | Status: DC | PRN
  Administered 2023-06-03: 2 mg via INTRAVENOUS

## 2023-06-03 MED ORDER — METHOCARBAMOL 500 MG PO TABS: 500.0000 mg | ORAL_TABLET | Freq: Four times a day (QID) | ORAL | 0 refills | Status: DC | PRN

## 2023-06-03 MED ORDER — OXYCODONE HCL 5 MG/5ML PO SOLN: 5.0000 mg | Freq: Once | ORAL | Status: DC | PRN

## 2023-06-03 MED ORDER — FENTANYL CITRATE PF 50 MCG/ML IJ SOSY
25.0000 ug | PREFILLED_SYRINGE | INTRAMUSCULAR | Status: DC | PRN
  Administered 2023-06-03: 50 ug via INTRAVENOUS

## 2023-06-03 MED ORDER — OXYCODONE HCL 5 MG PO TABS: 5.0000 mg | ORAL_TABLET | Freq: Once | ORAL | Status: DC | PRN

## 2023-06-03 MED ORDER — ONDANSETRON HCL 4 MG/2ML IJ SOLN: 4.0000 mg | Freq: Once | INTRAMUSCULAR | Status: DC | PRN

## 2023-06-03 MED ORDER — FENTANYL CITRATE PF 50 MCG/ML IJ SOSY
PREFILLED_SYRINGE | INTRAMUSCULAR | Status: DC
Start: 2023-06-03 — End: 2023-06-03
  Filled 2023-06-03: qty 1

## 2023-06-03 MED ORDER — TRAMADOL HCL 50 MG PO TABS: 50.0000 mg | ORAL_TABLET | Freq: Four times a day (QID) | ORAL | 0 refills | Status: DC | PRN

## 2023-06-03 MED ORDER — ACETAMINOPHEN 10 MG/ML IV SOLN
1000.0000 mg | Freq: Four times a day (QID) | INTRAVENOUS | Status: DC
  Administered 2023-06-03: 1000 mg via INTRAVENOUS
  Filled 2023-06-03: qty 100

## 2023-06-03 MED ORDER — LIDOCAINE HCL (PF) 2 % IJ SOLN
INTRAMUSCULAR | Status: DC | PRN
  Administered 2023-06-03: 40 mg via INTRADERMAL

## 2023-06-03 MED ORDER — ORAL CARE MOUTH RINSE: 15.0000 mL | Freq: Once | OROMUCOSAL | Status: AC

## 2023-06-03 MED ORDER — FENTANYL CITRATE (PF) 100 MCG/2ML IJ SOLN
INTRAMUSCULAR | Status: DC | PRN
  Administered 2023-06-03 (×2): 50 ug via INTRAVENOUS

## 2023-06-03 MED ORDER — CHLORHEXIDINE GLUCONATE 0.12 % MT SOLN
15.0000 mL | Freq: Once | OROMUCOSAL | Status: AC
  Administered 2023-06-03: 15 mL via OROMUCOSAL

## 2023-06-03 MED ORDER — ACETAMINOPHEN 10 MG/ML IV SOLN: 1000.0000 mg | Freq: Once | INTRAVENOUS | Status: DC | PRN

## 2023-06-03 MED ORDER — DEXAMETHASONE SODIUM PHOSPHATE 10 MG/ML IJ SOLN: 8.0000 mg | Freq: Once | INTRAMUSCULAR | Status: DC

## 2023-06-03 MED ORDER — PROPOFOL 500 MG/50ML IV EMUL
INTRAVENOUS | Status: DC | PRN
  Administered 2023-06-03: 80 mg via INTRAVENOUS
  Administered 2023-06-03: 20 mg via INTRAVENOUS

## 2023-06-03 MED ORDER — ONDANSETRON HCL 4 MG/2ML IJ SOLN
INTRAMUSCULAR | Status: DC | PRN
  Administered 2023-06-03: 4 mg via INTRAVENOUS

## 2023-06-03 MED ORDER — FENTANYL CITRATE (PF) 100 MCG/2ML IJ SOLN
INTRAMUSCULAR | Status: AC
  Filled 2023-06-03: qty 2

## 2023-06-03 MED ORDER — MIDAZOLAM HCL 2 MG/2ML IJ SOLN
INTRAMUSCULAR | Status: AC
Start: 2023-06-03 — End: ?
  Filled 2023-06-03: qty 2

## 2023-06-03 NOTE — Transfer of Care (Signed)
 Immediate Anesthesia Transfer of Care Note  Patient: Nicole Charles  Procedure(s) Performed: Procedure(s): RIGHT KNEE JOINT MANIPULATION WITH ANESTHESIA (Right)  Patient Location: PACU  Anesthesia Type:General  Level of Consciousness:  sedated, patient cooperative and responds to stimulation  Airway & Oxygen Therapy:Patient Spontanous Breathing and Patient connected to face mask oxgen  Post-op Assessment:  Report given to PACU RN and Post -op Vital signs reviewed and stable  Post vital signs:  Reviewed and stable  Last Vitals:  Vitals:   06/03/23 1455 06/03/23 1545  BP: (!) 168/110 (!) 149/91  Pulse: 65 (!) 55  Resp: 16   Temp: 36.9 C   SpO2: 98% 100%    Complications: No apparent anesthesia complications

## 2023-06-03 NOTE — Anesthesia Postprocedure Evaluation (Signed)
 Anesthesia Post Note  Patient: Nicole Charles  Procedure(s) Performed: RIGHT KNEE JOINT MANIPULATION WITH ANESTHESIA (Right: Knee)     Patient location during evaluation: PACU Anesthesia Type: General Level of consciousness: awake and alert Pain management: pain level controlled Vital Signs Assessment: post-procedure vital signs reviewed and stable Respiratory status: spontaneous breathing, nonlabored ventilation, respiratory function stable and patient connected to nasal cannula oxygen Cardiovascular status: blood pressure returned to baseline and stable Postop Assessment: no apparent nausea or vomiting Anesthetic complications: no   No notable events documented.  Last Vitals:  Vitals:   06/03/23 1630 06/03/23 1645  BP: 137/89 (!) 136/97  Pulse: (!) 58 (!) 52  Resp: 13 13  Temp:    SpO2: 93% 95%    Last Pain:  Vitals:   06/03/23 1645  TempSrc:   PainSc: 4                  Mariann Barter

## 2023-06-03 NOTE — Interval H&P Note (Signed)
 History and Physical Interval Note:  06/03/2023 3:31 PM  Nicole Charles  has presented today for surgery, with the diagnosis of Right Knee arthrofibrosis.  The various methods of treatment have been discussed with the patient and family. After consideration of risks, benefits and other options for treatment, the patient has consented to  Procedure(s): RIGHT KNEE JOINT MANIPULATION WITH ANESTHESIA (Right) as a surgical intervention.  The patient's history has been reviewed, patient examined, no change in status, stable for surgery.  I have reviewed the patient's chart and labs.  Questions were answered to the patient's satisfaction.     Homero Fellers Vernon Ariel

## 2023-06-03 NOTE — Op Note (Signed)
  OPERATIVE REPORT   PREOPERATIVE DIAGNOSIS: Arthrofibrosis, Right  knee.   POSTOPERATIVE DIAGNOSIS: Arthrofibrosis, Right knee.   PROCEDURE:  Right  knee closed manipulation.   SURGEON: Ollen Gross, MD   ASSISTANT: None.   ANESTHESIA: General.   COMPLICATIONS: None.   CONDITION: Stable to Recovery.   Pre-manipulation range of motion is 20-90.  Post-manipulation range of  Motion is 5-120  PROCEDURE IN DETAIL: After successful administration of general  anesthetic, exam under anesthesia was performed showing range of motion  20-90 degrees. I then placed my chest against the proximal tibia,  flexing the knee with audible lysis of adhesions. I was easily able to  get the knee flexed to 120  degrees. I then put the knee back in extension and with some  patellar manipulation and gentle pressure got within 5 degrees of full  Extension.The patient was subsequently awakened and transported to Recovery in  stable condition.

## 2023-06-03 NOTE — Anesthesia Preprocedure Evaluation (Signed)
 Anesthesia Evaluation  Patient identified by MRN, date of birth, ID band Patient awake    Reviewed: Allergy & Precautions, NPO status , Patient's Chart, lab work & pertinent test results, reviewed documented beta blocker date and time   History of Anesthesia Complications Negative for: history of anesthetic complications  Airway Mallampati: II       Dental no notable dental hx.    Pulmonary neg shortness of breath, neg COPD   breath sounds clear to auscultation       Cardiovascular hypertension, (-) angina (-) CAD, (-) Past MI and (-) PND (-) Valvular Problems/Murmurs Rhythm:Regular Rate:Normal     Neuro/Psych  Headaches, neg Seizures    GI/Hepatic ,GERD  Medicated and Controlled,,(+) neg Cirrhosis        Endo/Other  neg diabetes    Renal/GU Renal disease     Musculoskeletal  (+) Arthritis , Osteoarthritis,    Abdominal   Peds  Hematology   Anesthesia Other Findings   Reproductive/Obstetrics                              Anesthesia Physical Anesthesia Plan  ASA: 2  Anesthesia Plan: General   Post-op Pain Management:    Induction: Intravenous  PONV Risk Score and Plan: 3 and Ondansetron and Dexamethasone  Airway Management Planned: LMA  Additional Equipment:   Intra-op Plan:   Post-operative Plan: Extubation in OR  Informed Consent: I have reviewed the patients History and Physical, chart, labs and discussed the procedure including the risks, benefits and alternatives for the proposed anesthesia with the patient or authorized representative who has indicated his/her understanding and acceptance.     Dental advisory given  Plan Discussed with: CRNA  Anesthesia Plan Comments:          Anesthesia Quick Evaluation

## 2023-06-03 NOTE — Discharge Instructions (Signed)
 Nicole Gross, MD [facility] [place] [address], [address] [place], [place] [address] [phone]  POSTOPERATIVE DIRECTIONS    Knee Rehabilitation, Guidelines Following Surgery  Results after knee surgery are often greatly improved when you follow the exercise, range of motion and muscle strengthening exercises prescribed by your doctor. Safety measures are also important to protect the knee from further injury. If any of these exercises cause you to have increased pain or swelling in your knee joint, decrease the amount until you are comfortable again and slowly increase them. If you have problems or questions, call your caregiver or physical therapist for advice.   HOME CARE INSTRUCTIONS  Remove items at home which could result in a fall. This includes throw rugs or furniture in walking pathways.  ICE to the affected knee as much as tolerated. Icing helps control swelling. If the swelling is well controlled you will be more comfortable and rehab easier. Continue to use ice on the knee for pain and swelling from surgery. You may notice swelling that will progress down to the foot and ankle. This is normal after surgery. Elevate the leg when you are not up walking on it.    Continue to use the breathing machine which will help keep your temperature down. It is common for your temperature to cycle up and down following surgery, especially at night when you are not up moving around and exerting yourself. The breathing machine keeps your lungs expanded and your temperature down. Do not place pillow under the operative knee, focus on keeping the knee straight while resting  DIET You may resume your previous home diet once you are discharged from the hospital.   ACTIVITY For the first 5 days, the key is rest and control of pain and swelling RESUME USE OF YOUR JAS BRACE TONIGHT, 06/03/23. Do your home exercises twice a day starting on post-operative day 1.  On the days you go to physical therapy, just do the home exercises once that day. Avoid periods of inactivity such as sitting longer than an hour when not asleep. This helps prevent blood clots.  Do not drive while taking narcotics.  WEIGHT BEARING Weight bearing as tolerated, with assist device (walker, cane, etc) as needed.  POSTOPERATIVE CONSTIPATION PROTOCOL Constipation - defined medically as fewer than three stools per week and severe constipation as less than one stool per week.  One of the most common issues patients have following surgery is constipation.  Even if you have a regular bowel pattern at home, your normal regimen is likely to be disrupted due to multiple reasons following surgery.  Combination of anesthesia, postoperative narcotics, change in appetite and fluid intake all can affect your bowels.  In order to avoid complications following surgery, here are some recommendations in order to help you during your recovery period.  Colace (docusate) - Pick up an over-the-counter form of Colace or another stool softener and take twice a day as long as you are requiring postoperative pain medications.  Take with a full glass of water daily.  If you experience loose stools or diarrhea, hold the colace until you stool forms back up. If your symptoms do not get better within 1 week or if they get worse, check with your doctor. Dulcolax (bisacodyl) - Pick up over-the-counter and take as directed by the product packaging as needed to assist with the movement of your bowels.  Take with a full glass of water.  Use this product as needed  if not relieved by Colace only.  MiraLax (polyethylene glycol) - Pick up over-the-counter to have on hand. MiraLax is a solution that will increase the amount of water in your bowels to assist with bowel movements.  Take as directed and can mix with a glass of water, juice, soda, coffee, or tea. Take if you go more than two days without a movement. Do not use  MiraLax more than once per day. Call your doctor if you are still constipated or irregular after using this medication for 7 days in a row.  If you continue to have problems with postoperative constipation, please contact the office for further assistance and recommendations.  If you experience "the worst abdominal pain ever" or develop nausea or vomiting, please contact the office immediatly for further recommendations for treatment.  ITCHING If you experience itching with your medications, try taking only a single pain pill, or even half a pain pill at a time.  You can also use Benadryl over the counter for itching or also to help with sleep.   MEDICATIONS See your medication summary on the "After Visit Summary" that the nursing staff will review with you prior to discharge.  You may have some home medications which will be placed on hold until you complete the course of blood thinner medication.  It is important for you to complete the blood thinner medication as prescribed by your surgeon.  Continue your approved medications as instructed at time of discharge.  PRECAUTIONS If you experience chest pain or shortness of breath - call 911 immediately for transfer to the hospital emergency department.  If you develop a fever greater that 101 F, purulent drainage from wound, increased redness or drainage from wound, foul odor from the wound/dressing, or calf pain - CONTACT YOUR SURGEON.                                                   FOLLOW-UP APPOINTMENTS Make sure you keep all of your appointments after your operation with your surgeon and caregivers. You should call the office at the above phone number and make an appointment for approximately two weeks after the date of your surgery or on the date instructed by your surgeon outlined in the "After Visit Summary".  RANGE OF MOTION AND STRENGTHENING EXERCISES  Rehabilitation of the knee is important following a knee injury or an operation. After  just a few days of immobilization, the muscles of the thigh which control the knee become weakened and shrink (atrophy). Knee exercises are designed to build up the tone and strength of the thigh muscles and to improve knee motion. Often times heat used for twenty to thirty minutes before working out will loosen up your tissues and help with improving the range of motion but do not use heat for the first two weeks following surgery. These exercises can be done on a training (exercise) mat, on the floor, on a table or on a bed. Use what ever works the best and is most comfortable for you Knee exercises include:  Leg Lifts - While your knee is still immobilized in a splint or cast, you can do straight leg raises. Lift the leg to 60 degrees, hold for 3 sec, and slowly lower the leg. Repeat 10-20 times 2-3 times daily. Perform this exercise against resistance later as your knee gets  better.  Quad and Hamstring Sets - Tighten up the muscle on the front of the thigh (Quad) and hold for 5-10 sec. Repeat this 10-20 times hourly. Hamstring sets are done by pushing the foot backward against an object and holding for 5-10 sec. Repeat as with quad sets.  Leg Slides: Lying on your back, slowly slide your foot toward your buttocks, bending your knee up off the floor (only go as far as is comfortable). Then slowly slide your foot back down until your leg is flat on the floor again. Angel Wings: Lying on your back spread your legs to the side as far apart as you can without causing discomfort.  A rehabilitation program following serious knee injuries can speed recovery and prevent re-injury in the future due to weakened muscles. Contact your doctor or a physical therapist for more information on knee rehabilitation.   POST-OPERATIVE OPIOID TAPER INSTRUCTIONS: It is important to wean off of your opioid medication as soon as possible. If you do not need pain medication after your surgery it is ok to stop day one. Opioids  include: Codeine, Hydrocodone(Norco, Vicodin), Oxycodone(Percocet, oxycontin) and hydromorphone amongst others.  Long term and even short term use of opiods can cause: Increased pain response Dependence Constipation Depression Respiratory depression And more.  Withdrawal symptoms can include Flu like symptoms Nausea, vomiting And more Techniques to manage these symptoms Hydrate well Eat regular healthy meals Stay active Use relaxation techniques(deep breathing, meditating, yoga) Do Not substitute Alcohol to help with tapering If you have been on opioids for less than two weeks and do not have pain than it is ok to stop all together.  Plan to wean off of opioids This plan should start within one week post op of your joint replacement. Maintain the same interval or time between taking each dose and first decrease the dose.  Cut the total daily intake of opioids by one tablet each day Next start to increase the time between doses. The last dose that should be eliminated is the evening dose.   MAKE SURE YOU:  Understand these instructions.  Get help right away if you are not doing well or get worse.   DENTAL ANTIBIOTICS:  In most cases prophylactic antibiotics for Dental procdeures after total joint surgery are not necessary.  Exceptions are as follows:  1. History of prior total joint infection  2. Severely immunocompromised (Organ Transplant, cancer chemotherapy, Rheumatoid biologic meds such as Humera)  3. Poorly controlled diabetes (A1C &gt; 8.0, blood glucose over 200)  If you have one of these conditions, contact your surgeon for an antibiotic prescription, prior to your dental procedure.    Pick up stool softner and laxative for home use following surgery while on pain medications. Do not submerge incision under water. Please use good hand washing techniques while changing dressing each day. May shower starting three days after surgery. Please use a clean towel  to pat the incision dry following showers. Continue to use ice for pain and swelling after surgery. Do not use any lotions or creams on the incision until instructed by your surgeon.

## 2023-06-04 ENCOUNTER — Encounter (HOSPITAL_COMMUNITY): Payer: Self-pay | Admitting: Orthopedic Surgery

## 2023-12-09 NOTE — H&P (Signed)
 ORTHOPEDIC H&P  Patient is being admitted for manipulation under anesthesia or right total knee arthroplasty.  Subjective:  Chief Complaint: Right knee stiffness  HPI: Nicole Charles is a 48 year old female who presents for a recheck following a right knee femoral revision. She reports that her knee remains unchanged since her last visit, with no improvement in straightening or bending. She notes that the knee is not swollen and does not exhibit warmth. She has lost approximately 10 degrees of flexion since her last visit, now able to flex only to 85 degrees. Extension is approximately 15 degrees from fully straight. She describes difficulty bending the knee when walking up stairs, which causes discomfort, though straightening does not elicit pain. She states that her last day of therapy was last week and has not attended therapy this week. She expresses interest in proceeding with a closed manipulation under anesthesia to address the loss of flexion and is aware of the need to resume therapy immediately afterward.There is no active infection.  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   Osteoarthritis of right knee 01/02/2023   Knee pain, right anterior 10/22/2018    Past Medical History:  Diagnosis Date   Arthritis    GERD (gastroesophageal reflux disease)    Headache    Hypertension 2019    Past Surgical History:  Procedure Laterality Date   EXAM UNDER ANESTHESIA WITH MANIPULATION OF KNEE Right 06/03/2023   Procedure: RIGHT KNEE JOINT MANIPULATION WITH ANESTHESIA;  Surgeon: Melodi Lerner, MD;  Location: [facility];  Service: Orthopedics;  Laterality: Right;   HERNIA REPAIR  2007   umbilical   KNEE ARTHROSCOPY Right 10/22/2018   Procedure: Right knee arthroscopy; chondroplasty, Meniscal Debridemnt;  Surgeon: Melodi Lerner, MD;  Location: [facility];  Service: Orthopedics;  Laterality: Right;     KNEE ARTHROSCOPY W/ DEBRIDEMENT     x3   TOTAL KNEE REVISION Right 01/02/2023   Procedure: Right total knee arthroplasty femoral revision;  Surgeon: Melodi Lerner, MD;  Location: [facility];  Service: Orthopedics;  Laterality: Right;   TUBAL LIGATION      Prior to Admission medications   Medication Sig Start Date End Date Taking? Authorizing Provider  amLODipine  (NORVASC ) 2.5 MG tablet Take 2.5 mg by mouth daily.    [provider]  busPIRone  (BUSPAR ) 5 MG tablet Take 5 mg by mouth daily.    [provider]  carvedilol  (COREG ) 12.5 MG tablet Take 12.5 mg by mouth 2 (two) times daily with a meal.    [provider]  diphenhydramine -acetaminophen  (TYLENOL  PM) 25-500 MG TABS tablet Take 1 tablet by mouth at bedtime as needed (sleep).    [provider]  hydrochlorothiazide  (HYDRODIURIL ) 25 MG tablet Take 25 mg by mouth daily.    [provider]  methocarbamol  (ROBAXIN ) 500 MG tablet Take 1 tablet (500 mg total) by mouth every 6 (six) hours as needed. 06/03/23   Gurshaan Matsuoka Zeman, PA-C  ondansetron  (ZOFRAN ) 4 MG tablet Take 1 tablet (4 mg total) by mouth every 6 (six) hours as needed for nausea. Patient not taking: Reported on 05/27/2023 01/03/23   Kristian Stabs, PA  pantoprazole  (PROTONIX ) 40 MG tablet Take 40 mg by mouth daily. 10/09/22   [provider]  traMADol  (ULTRAM ) 50 MG tablet Take 1 tablet (50 mg total) by mouth every 6 (six) hours as needed. 06/03/23 06/02/24  Cj Edgell Zeman, PA-C  Allergies  Allergen Reactions   Lisinopril Other (See Comments)    Headache    Social History   Socioeconomic History   Marital status: Single    Spouse name: Not on file   Number of children: Not on file   Years of education: Not on file   Highest education level: Not on file  Occupational History   Not on file  Tobacco Use   Smoking status: Never   Smokeless tobacco: Never  Vaping Use   Vaping status: Never Used   Substance and Sexual Activity   Alcohol use: Not Currently    Comment: occ.   Drug use: Never   Sexual activity: Not on file  Other Topics Concern   Not on file  Social History Narrative   Not on file   Social Drivers of Health   Financial Resource Strain: Not on file  Food Insecurity: No Food Insecurity (01/02/2023)   Hunger Vital Sign    Worried About Running Out of Food in the Last Year: Never true    Ran Out of Food in the Last Year: Never true  Transportation Needs: No Transportation Needs (01/02/2023)   PRAPARE - Administrator, Civil Service (Medical): No    Lack of Transportation (Non-Medical): No  Physical Activity: Not on file  Stress: Not on file  Social Connections: Not on file  Intimate Partner Violence: Not At Risk (01/02/2023)   Humiliation, Afraid, Rape, and Kick questionnaire    Fear of Current or Ex-Partner: No    Emotionally Abused: No    Physically Abused: No    Sexually Abused: No    Tobacco Use: Low Risk  (06/03/2023)   Patient History    Smoking Tobacco Use: Never    Smokeless Tobacco Use: Never    Passive Exposure: Not on file   Social History   Substance and Sexual Activity  Alcohol Use Not Currently   Comment: occ.    No family history on file.  ROS  Objective:  Physical Exam: - General: Well-developed female alert and oriented in no apparent distress  - Musculoskeletal: Right knee demonstrates no warmth or effusion. Extension is approximately 15 degrees from fully straight. Flexion is limited to 85 degrees, indicating a loss of 10 degrees of flexion.  Imaging Review  Radiographs AP and lateral views taken on 02/06/23 demonstrate the femoral revision and the entire knee overall in good position with no periprosthetic abnormalities.  Assessment/Plan:  ASSESSMENT: Right total knee femoral revision with persistent range of motion limitations. The patient has lost flexion and continues to experience difficulty with bending  the knee, particularly when walking up stairs. Extension has improved slightly, and she is walking better with the use of an extension brace. A closed manipulation under anesthesia is recommended to regain flexion, with an anticipated improvement of 15 to 20 degrees. Immediate resumption of physical therapy post-manipulation is advised.  PLAN: Proceed with scheduling a closed manipulation under anesthesia to address the loss of flexion in the right knee. The patient will be contacted by the scheduler to arrange the procedure within the next few weeks. Following the manipulation, the patient will resume physical therapy to optimize recovery and improve range of motion. The patient is aware of the limitations of the manipulation in improving extension and understands the risks and benefits of the procedure.   Patient's anticipated LOS is less than 2 midnights, meeting these requirements: - Younger than 63 - Lives within 1 hour of care - Has a  competent adult at home to recover with post-op recover - NO history of  - Chronic pain requiring opiods  - Diabetes  - Coronary Artery Disease  - Heart failure  - Heart attack  - Stroke  - DVT/VTE  - Cardiac arrhythmia  - Respiratory Failure/COPD  - Renal failure  - Anemia  - Advanced Liver disease  - Patient was instructed on what medications to stop prior to surgery. - Follow-up visit in 2 weeks with Dr. Melodi - Begin physical therapy following surgery - Pre-operative lab work as pre-surgical testing - Prescriptions will be provided in hospital at time of discharge  Corean Sender, PA-C Orthopedic Surgery [facility] [place]

## 2023-12-10 NOTE — Progress Notes (Signed)
  Date of COVID positive in last 90 days:  No  PCP - Berwyn Pizza, FNP Cardiologist -  N/A  Chest x-ray - N/A EKG - 12-20-22 Epic Stress Test - N/A ECHO - N/A Cardiac Cath -  Pacemaker/ICD device last checked:N/A Spinal Cord Stimulator:N/A Coronary CT - 09-01-20 Epic  Bowel Prep - N/A  Sleep Study - N/A CPAP -   Fasting Blood Sugar - N/A Checks Blood Sugar _____ times a day  Last dose of GLP1 agonist-  N/A GLP1 instructions:  Do not take after     Last dose of SGLT-2 inhibitors-  N/A SGLT-2 instructions:  Do not take after    Blood Thinner Instructions: N/A  Aspirin  Instructions:N/A Last Dose:  Activity level:  Can go up a flight of stairs and perform activities of daily living without stopping and without symptoms of chest pain or shortness of breath.  Anesthesia review: N/A  Patient denies shortness of breath, fever, cough and chest pain at PAT appointment  Patient verbalized understanding of instructions that were given to them at the PAT appointment. Patient was also instructed that they will need to review over the PAT instructions again at home before surgery.

## 2023-12-10 NOTE — Progress Notes (Signed)
 Sent message, via epic in basket, requesting orders in epic from Careers adviser.

## 2023-12-10 NOTE — Patient Instructions (Addendum)
 SURGICAL WAITING ROOM VISITATION Patients having surgery or a procedure may have no more than 2 support people in the waiting area - these visitors may rotate.    Children under the age of 48 must have an adult with them who is not the patient.  If the patient needs to stay at the hospital during part of their recovery, the visitor guidelines for inpatient rooms apply. Pre-op nurse will coordinate an appropriate time for 1 support person to accompany patient in pre-op.  This support person may not rotate.    Please refer to the [facility] website for the visitor guidelines for Inpatients (after your surgery is over and you are in a regular room).       Your procedure is scheduled on: 12-16-23   Report to [facility] Main Entrance    Report to admitting at 12:30 PM   Call this number if you have problems the morning of surgery [phone]   Do not eat food :After Midnight.   After Midnight you may have the following liquids until 11:45 AM DAY OF SURGERY  Water Non-Citrus Juices (without pulp, NO RED-Apple, White grape, White cranberry) Black Coffee (NO MILK/CREAM OR CREAMERS, sugar ok)  Clear Tea (NO MILK/CREAM OR CREAMERS, sugar ok) regular and decaf                             Plain Jell-O (NO RED)                                           Fruit ices (not with fruit pulp, NO RED)                                     Popsicles (NO RED)                                                               Sports drinks like Gatorade (NO RED)                   The day of surgery:  Drink ONE (1) Pre-Surgery Clear Ensure by 11:45 AM the morning of surgery. Drink in one sitting. Do not sip.  This drink was given to you during your hospital  pre-op appointment visit. Nothing else to drink after completing the Pre-Surgery Clear Ensure.          If you have questions, please contact your surgeon's office.   FOLLOW  ANY ADDITIONAL PRE OP INSTRUCTIONS YOU RECEIVED FROM YOUR  SURGEON'S OFFICE!!!     Oral Hygiene is also important to reduce your risk of infection.                                    Remember - BRUSH YOUR TEETH THE MORNING OF SURGERY WITH YOUR REGULAR TOOTHPASTE   Do NOT smoke after Midnight   Take these medicines the morning of surgery with A SIP OF WATER:    Amlodipine    Carvedilol   Pantoprazole   Stop all vitamins and herbal supplements 7 days before surgery                              You may not have any metal on your body including hair pins, jewelry, and body piercing             Do not wear make-up, lotions, powders, perfumes or deodorant  Do not wear nail polish including gel and S&S, artificial/acrylic nails, or any other type of covering on natural nails including finger and toenails. If you have artificial nails, gel coating, etc. that needs to be removed by a nail salon please have this removed prior to surgery or surgery may need to be canceled/ delayed if the surgeon/ anesthesia feels like they are unable to be safely monitored.   Do not shave  48 hours prior to surgery.     Do not bring valuables to the hospital. [facility] IS NOT RESPONSIBLE   FOR VALUABLES.   Contacts, dentures or bridgework may not be worn into surgery.  DO NOT BRING YOUR HOME MEDICATIONS TO THE HOSPITAL. PHARMACY WILL DISPENSE MEDICATIONS LISTED ON YOUR MEDICATION LIST TO YOU DURING YOUR ADMISSION IN THE HOSPITAL!    Patients discharged on the day of surgery will not be allowed to drive home.  Someone NEEDS to stay with you for the first 24 hours after anesthesia.   Special Instructions: Bring a copy of your healthcare power of attorney and living will documents the day of surgery if you haven't scanned them before.              Please read over the following fact sheets you were given: IF YOU HAVE QUESTIONS ABOUT YOUR PRE-OP INSTRUCTIONS PLEASE CALL [phone] [facility]  If you received a COVID test during your pre-op visit  it is requested that you  wear a mask when out in public, stay away from anyone that may not be feeling well and notify your surgeon if you develop symptoms. If you test positive for Covid or have been in contact with anyone that has tested positive in the last 10 days please notify you surgeon.  [facility] - Preparing for Surgery Before surgery, you can play an important role.  Because skin is not sterile, your skin needs to be as free of germs as possible.  You can reduce the number of germs on your skin by washing with CHG (chlorahexidine gluconate) soap before surgery.  CHG is an antiseptic cleaner which kills germs and bonds with the skin to continue killing germs even after washing. Please DO NOT use if you have an allergy to CHG or antibacterial soaps.  If your skin becomes reddened/irritated stop using the CHG and inform your nurse when you arrive at Short Stay. Do not shave (including legs and underarms) for at least 48 hours prior to the first CHG shower.  You may shave your face/neck.  Please follow these instructions carefully:  1.  Shower with CHG Soap the night before surgery and the  morning of surgery.  2.  If you choose to wash your hair, wash your hair first as usual with your normal  shampoo.  3.  After you shampoo, rinse your hair and body thoroughly to remove the shampoo.                             4.  Use CHG as you would any other liquid soap.  You can apply chg directly to the skin and wash.  Gently with a scrungie or clean washcloth.  5.  Apply the CHG Soap to your body ONLY FROM THE NECK DOWN.   Do   not use on face/ open                           Wound or open sores. Avoid contact with eyes, ears mouth and   genitals (private parts).                       Wash face,  Genitals (private parts) with your normal soap.             6.  Wash thoroughly, paying special attention to the area where your    surgery  will be performed.  7.  Thoroughly rinse your body with warm water from the neck down.  8.   DO NOT shower/wash with your normal soap after using and rinsing off the CHG Soap.                9.  Pat yourself dry with a clean towel.            10.  Wear clean pajamas.            11.  Place clean sheets on your bed the night of your first shower and do not  sleep with pets. Day of Surgery : Do not apply any lotions/deodorants the morning of surgery.  Please wear clean clothes to the hospital/surgery center.  FAILURE TO FOLLOW THESE INSTRUCTIONS MAY RESULT IN THE CANCELLATION OF YOUR SURGERY  PATIENT SIGNATURE_________________________________  NURSE SIGNATURE__________________________________  ________________________________________________________________________

## 2023-12-12 ENCOUNTER — Other Ambulatory Visit: Payer: Self-pay

## 2023-12-12 ENCOUNTER — Encounter (HOSPITAL_COMMUNITY): Payer: Self-pay

## 2023-12-12 ENCOUNTER — Encounter (HOSPITAL_COMMUNITY)
Admission: RE | Admit: 2023-12-12 | Discharge: 2023-12-12 | Disposition: A | Discharge status: Home / Self Care | Source: Ambulatory Visit | Attending: Orthopedic Surgery | Admitting: Orthopedic Surgery

## 2023-12-12 VITALS — Ht 63.0 in | Wt 145.0 lb

## 2023-12-12 DIAGNOSIS — I10 Essential (primary) hypertension: Secondary | ICD-10-CM

## 2023-12-16 ENCOUNTER — Ambulatory Visit (HOSPITAL_BASED_OUTPATIENT_CLINIC_OR_DEPARTMENT_OTHER): Payer: Self-pay

## 2023-12-16 ENCOUNTER — Other Ambulatory Visit: Payer: Self-pay

## 2023-12-16 ENCOUNTER — Ambulatory Visit (HOSPITAL_COMMUNITY): Payer: Self-pay | Admitting: Physician Assistant

## 2023-12-16 ENCOUNTER — Ambulatory Visit (HOSPITAL_COMMUNITY)
Admission: RE | Admit: 2023-12-16 | Discharge: 2023-12-16 | Disposition: A | Discharge status: Home / Self Care | Attending: Orthopedic Surgery | Admitting: Orthopedic Surgery

## 2023-12-16 ENCOUNTER — Encounter (HOSPITAL_COMMUNITY): Payer: Self-pay | Admitting: Orthopedic Surgery

## 2023-12-16 ENCOUNTER — Encounter (HOSPITAL_COMMUNITY)
Admission: RE | Disposition: A | Discharge status: Home / Self Care | Payer: Self-pay | Source: Home / Self Care | Attending: Orthopedic Surgery

## 2023-12-16 DIAGNOSIS — I10 Essential (primary) hypertension: Secondary | ICD-10-CM | POA: Diagnosis not present

## 2023-12-16 DIAGNOSIS — M1990 Unspecified osteoarthritis, unspecified site: Secondary | ICD-10-CM | POA: Insufficient documentation

## 2023-12-16 DIAGNOSIS — T8482XA Fibrosis due to internal orthopedic prosthetic devices, implants and grafts, initial encounter: Secondary | ICD-10-CM | POA: Diagnosis present

## 2023-12-16 DIAGNOSIS — K219 Gastro-esophageal reflux disease without esophagitis: Secondary | ICD-10-CM | POA: Diagnosis not present

## 2023-12-16 DIAGNOSIS — R519 Headache, unspecified: Secondary | ICD-10-CM | POA: Diagnosis not present

## 2023-12-16 DIAGNOSIS — M24669 Ankylosis, unspecified knee: Secondary | ICD-10-CM | POA: Diagnosis present

## 2023-12-16 DIAGNOSIS — M24661 Ankylosis, right knee: Secondary | ICD-10-CM

## 2023-12-16 DIAGNOSIS — Z79899 Other long term (current) drug therapy: Secondary | ICD-10-CM | POA: Diagnosis not present

## 2023-12-16 DIAGNOSIS — X58XXXA Exposure to other specified factors, initial encounter: Secondary | ICD-10-CM | POA: Insufficient documentation

## 2023-12-16 HISTORY — PX: KNEE CLOSED REDUCTION: SHX995

## 2023-12-16 LAB — BASIC METABOLIC PANEL WITH GFR
Anion gap: 9 (ref 5–15)
BUN: 16 mg/dL (ref 6–20)
CO2: 28 mmol/L (ref 22–32)
Calcium: 9.5 mg/dL (ref 8.9–10.3)
Chloride: 101 mmol/L (ref 98–111)
Creatinine, Ser: 0.81 mg/dL (ref 0.44–1.00)
GFR, Estimated: >60 mL/min (ref >60)
Glucose, Bld: 94 mg/dL (ref 70–99)
Potassium: 3.4 mmol/L — ABNORMAL LOW (ref 3.5–5.1)
Sodium: 138 mmol/L (ref 135–145)

## 2023-12-16 LAB — POCT PREGNANCY, URINE: Preg Test, Ur: NEGATIVE

## 2023-12-16 SURGERY — MANIPULATION, KNEE, CLOSED
Anesthesia: General | Site: Knee | Laterality: Right

## 2023-12-16 MED ORDER — FENTANYL CITRATE (PF) 100 MCG/2ML IJ SOLN
INTRAMUSCULAR | Status: AC
  Filled 2023-12-16: qty 2

## 2023-12-16 MED ORDER — MIDAZOLAM HCL 5 MG/5ML IJ SOLN
INTRAMUSCULAR | Status: DC | PRN
Start: 2023-12-16 — End: 2023-12-16
  Administered 2023-12-16: 2 mg via INTRAVENOUS

## 2023-12-16 MED ORDER — PROPOFOL 10 MG/ML IV BOLUS
INTRAVENOUS | Status: AC
  Filled 2023-12-16: qty 20

## 2023-12-16 MED ORDER — METHYLPREDNISOLONE 4 MG PO TBPK
ORAL_TABLET | ORAL | 0 refills | Status: AC
Start: 2023-12-16 — End: ?

## 2023-12-16 MED ORDER — FENTANYL CITRATE (PF) 100 MCG/2ML IJ SOLN
INTRAMUSCULAR | Status: DC | PRN
  Administered 2023-12-16 (×4): 25 ug via INTRAVENOUS

## 2023-12-16 MED ORDER — MIDAZOLAM HCL 2 MG/2ML IJ SOLN
INTRAMUSCULAR | Status: AC
  Filled 2023-12-16: qty 2

## 2023-12-16 MED ORDER — HYDROCODONE-ACETAMINOPHEN 5-325 MG PO TABS
1.0000 | ORAL_TABLET | Freq: Four times a day (QID) | ORAL | 0 refills | Status: AC | PRN
Start: 2023-12-16 — End: ?

## 2023-12-16 MED ORDER — METHOCARBAMOL 500 MG PO TABS: 500.0000 mg | ORAL_TABLET | Freq: Four times a day (QID) | ORAL | 0 refills | Status: DC | PRN

## 2023-12-16 MED ORDER — DEXAMETHASONE SOD PHOSPHATE PF 10 MG/ML IJ SOLN
8.0000 mg | Freq: Once | INTRAMUSCULAR | Status: AC
  Administered 2023-12-16: 8 mg via INTRAVENOUS

## 2023-12-16 MED ORDER — ACETAMINOPHEN 10 MG/ML IV SOLN
1000.0000 mg | Freq: Four times a day (QID) | INTRAVENOUS | Status: DC
  Administered 2023-12-16: 1000 mg via INTRAVENOUS
  Filled 2023-12-16: qty 100

## 2023-12-16 MED ORDER — LIDOCAINE HCL (CARDIAC) PF 100 MG/5ML IV SOSY
PREFILLED_SYRINGE | INTRAVENOUS | Status: DC | PRN
  Administered 2023-12-16: 40 mg via INTRATRACHEAL

## 2023-12-16 MED ORDER — HYDROMORPHONE HCL 1 MG/ML IJ SOLN: 0.2500 mg | INTRAMUSCULAR | Status: DC | PRN

## 2023-12-16 MED ORDER — LACTATED RINGERS IV SOLN: INTRAVENOUS | Status: DC

## 2023-12-16 MED ORDER — ONDANSETRON HCL 4 MG/2ML IJ SOLN
INTRAMUSCULAR | Status: DC | PRN
  Administered 2023-12-16: 4 mg via INTRAVENOUS

## 2023-12-16 MED ORDER — ACETAMINOPHEN 500 MG PO TABS
1000.0000 mg | ORAL_TABLET | Freq: Once | ORAL | Status: AC
Start: 2023-12-16 — End: 2023-12-16

## 2023-12-16 MED ORDER — LIDOCAINE HCL (PF) 2 % IJ SOLN
INTRAMUSCULAR | Status: AC
  Filled 2023-12-16: qty 5

## 2023-12-16 MED ORDER — CHLORHEXIDINE GLUCONATE 0.12 % MT SOLN
15.0000 mL | Freq: Once | OROMUCOSAL | Status: AC
  Administered 2023-12-16: 15 mL via OROMUCOSAL

## 2023-12-16 MED ORDER — ORAL CARE MOUTH RINSE: 15.0000 mL | Freq: Once | OROMUCOSAL | Status: AC

## 2023-12-16 MED ORDER — PROPOFOL 500 MG/50ML IV EMUL
INTRAVENOUS | Status: DC | PRN
  Administered 2023-12-16: 50 ug/kg/min via INTRAVENOUS
  Administered 2023-12-16: 40 mg via INTRAVENOUS
  Administered 2023-12-16: 50 mg via INTRAVENOUS

## 2023-12-16 SURGICAL SUPPLY — 5 items
BAG COUNTER SPONGE SURGICOUNT (BAG) IMPLANT
BNDG ADH 1X3 SHEER STRL LF (GAUZE/BANDAGES/DRESSINGS) IMPLANT
GAUZE SPONGE 4X4 12PLY STRL (GAUZE/BANDAGES/DRESSINGS) IMPLANT
KIT TURNOVER KIT A (KITS) ×1 IMPLANT
SYR CONTROL 10ML LL (SYRINGE) IMPLANT

## 2023-12-16 NOTE — Interval H&P Note (Signed)
 History and Physical Interval Note:  12/16/2023 1:14 PM  Nicole Charles  has presented today for surgery, with the diagnosis of Right knee arthrofibrosis.  The various methods of treatment have been discussed with the patient and family. After consideration of risks, benefits and other options for treatment, the patient has consented to  Procedure(s): MANIPULATION, KNEE, CLOSED (Right) as a surgical intervention.  The patient's history has been reviewed, patient examined, no change in status, stable for surgery.  I have reviewed the patient's chart and labs.  Questions were answered to the patient's satisfaction.     Dempsey Nicole Charles

## 2023-12-16 NOTE — Transfer of Care (Signed)
 Immediate Anesthesia Transfer of Care Note  Patient: Lela Gearing  Procedure(s) Performed: MANIPULATION, KNEE, CLOSED (Right: Knee)  Patient Location: PACU  Anesthesia Type:MAC  Level of Consciousness: drowsy  Airway & Oxygen Therapy: Patient Spontanous Breathing and Patient connected to face mask oxygen  Post-op Assessment: Report given to RN and Post -op Vital signs reviewed and stable  Post vital signs: Reviewed and stable  Last Vitals:  Vitals Value Taken Time  BP 113/75 12/16/23 14:13  Temp    Pulse 58 12/16/23 14:14  Resp 13 12/16/23 14:14  SpO2 99 % 12/16/23 14:14  Vitals shown include unfiled device data.  Last Pain:  Vitals:   12/16/23 1306  TempSrc:   PainSc: 0-No pain         Complications: No notable events documented.

## 2023-12-16 NOTE — Anesthesia Postprocedure Evaluation (Signed)
 Anesthesia Post Note  Patient: Nicole Charles  Procedure(s) Performed: MANIPULATION, KNEE, CLOSED (Right: Knee)     Patient location during evaluation: PACU Anesthesia Type: General Level of consciousness: awake and alert Pain management: pain level controlled Vital Signs Assessment: post-procedure vital signs reviewed and stable Respiratory status: spontaneous breathing, nonlabored ventilation and respiratory function stable Cardiovascular status: blood pressure returned to baseline and stable Postop Assessment: no apparent nausea or vomiting Anesthetic complications: no   No notable events documented.  Last Vitals:  Vitals:   12/16/23 1441 12/16/23 1455  BP: (!) 137/93 (!) 138/92  Pulse: (!) 57 (!) 54  Resp: 16 14  Temp: (!) 36.4 C (!) 36.4 C  SpO2: 98% 98%    Last Pain:  Vitals:   12/16/23 1455  TempSrc:   PainSc: 3                  Itai Barbian,W. EDMOND

## 2023-12-16 NOTE — Anesthesia Preprocedure Evaluation (Addendum)
 Anesthesia Evaluation  Patient identified by MRN, date of birth, ID band Patient awake    Reviewed: Allergy & Precautions, H&P , NPO status , Patient's Chart, lab work & pertinent test results, reviewed documented beta blocker date and time   Airway Mallampati: II  TM Distance: >3 FB Neck ROM: Full    Dental no notable dental hx. (+) Teeth Intact, Dental Advisory Given   Pulmonary neg pulmonary ROS   Pulmonary exam normal breath sounds clear to auscultation       Cardiovascular hypertension, Pt. on medications and Pt. on home beta blockers  Rhythm:Regular Rate:Normal     Neuro/Psych  Headaches  negative psych ROS   GI/Hepatic Neg liver ROS,GERD  Medicated,,  Endo/Other  negative endocrine ROS    Renal/GU negative Renal ROS  negative genitourinary   Musculoskeletal  (+) Arthritis , Osteoarthritis,    Abdominal   Peds  Hematology negative hematology ROS (+)   Anesthesia Other Findings   Reproductive/Obstetrics negative OB ROS                              Anesthesia Physical Anesthesia Plan  ASA: 2  Anesthesia Plan: General   Post-op Pain Management: Tylenol  PO (pre-op)*   Induction: Intravenous  PONV Risk Score and Plan: 4 or greater and Ondansetron  and Dexamethasone   Airway Management Planned: Mask  Additional Equipment:   Intra-op Plan:   Post-operative Plan:   Informed Consent: I have reviewed the patients History and Physical, chart, labs and discussed the procedure including the risks, benefits and alternatives for the proposed anesthesia with the patient or authorized representative who has indicated his/her understanding and acceptance.     Dental advisory given  Plan Discussed with: CRNA  Anesthesia Plan Comments:          Anesthesia Quick Evaluation

## 2023-12-16 NOTE — Discharge Instructions (Addendum)
 Nicole Moan, MD [facility] [address] [place], [place] [address] [phone]  POSTOPERATIVE DIRECTIONS  Knee Rehabilitation, Guidelines Following Surgery  Results after knee surgery are often greatly improved when you follow the exercise, range of motion and muscle strengthening exercises prescribed by your doctor. Safety measures are also important to protect the knee from further injury. If any of these exercises cause you to have increased pain or swelling in your knee joint, decrease the amount until you are comfortable again and slowly increase them. If you have problems or questions, call your caregiver or physical therapist for advice.   ACTIVITY For the first 5 days, the key is rest and control of pain and swelling Do your home exercises twice a day starting on post-operative day 3. On the days you go to physical therapy, just do the home exercises once that day. You should rest, ice and elevate the leg for 50 minutes out of every hour. Get up and walk/stretch for 10 minutes per hour. After 5 days you can increase your activity slowly as tolerated. Walk with your walker as instructed. Use the walker until you are comfortable transitioning to a cane. Walk with the cane in the opposite hand of the operative leg. You may discontinue the cane once you are comfortable and walking steadily. Avoid periods of inactivity such as sitting longer than an hour when not asleep. This helps prevent blood clots.  You may discontinue the knee immobilizer once you are able to perform a straight leg raise while lying down. You may resume a sexual relationship in one month or when given the OK by your doctor.  You may return to work once you are cleared by your doctor.  Do not drive a car for 6 weeks or until released by your surgeon.  Do not drive while taking narcotics.  WEIGHT BEARING Weight bearing as tolerated with assist device (walker,  cane, etc) as directed, use it as long as suggested by your surgeon or therapist, typically at least 4-6 weeks.  POSTOPERATIVE CONSTIPATION PROTOCOL Constipation - defined medically as fewer than three stools per week and severe constipation as less than one stool per week.  One of the most common issues patients have following surgery is constipation.  Even if you have a regular bowel pattern at home, your normal regimen is likely to be disrupted due to multiple reasons following surgery.  Combination of anesthesia, postoperative narcotics, change in appetite and fluid intake all can affect your bowels.  In order to avoid complications following surgery, here are some recommendations in order to help you during your recovery period.  Colace (docusate) - Pick up an over-the-counter form of Colace or another stool softener and take twice a day as long as you are requiring postoperative pain medications.  Take with a full glass of water daily.  If you experience loose stools or diarrhea, hold the colace until you stool forms back up. If your symptoms do not get better within 1 week or if they get worse, check with your doctor. Dulcolax (bisacodyl ) - Pick up over-the-counter and take as directed by the product packaging as needed to assist with the movement of your bowels.  Take with a full glass of water.  Use this product as needed if not relieved by Colace only.  MiraLax  (polyethylene glycol) - Pick up over-the-counter to have on hand. MiraLax  is a solution that will increase the amount of water in your bowels to  assist with bowel movements.  Take as directed and can mix with a glass of water, juice, soda, coffee, or tea. Take if you go more than two days without a movement. Do not use MiraLax  more than once per day. Call your doctor if you are still constipated or irregular after using this medication for 7 days in a row.  If you continue to have problems with postoperative constipation, please contact  the office for further assistance and recommendations.  If you experience the worst abdominal pain ever or develop nausea or vomiting, please contact the office immediatly for further recommendations for treatment.  ITCHING If you experience itching with your medications, try taking only a single pain pill, or even half a pain pill at a time.  You can also use Benadryl  over the counter for itching or also to help with sleep.   MEDICATIONS See your medication summary on the "After Visit Summary" that the nursing staff will review with you prior to discharge.  You may have some home medications which will be placed on hold until you complete the course of blood thinner medication.  It is important for you to complete the blood thinner medication as prescribed by your surgeon.  Continue your approved medications as instructed at time of discharge.  PRECAUTIONS If you experience chest pain or shortness of breath - call 911 immediately for transfer to the hospital emergency department.  If you develop a fever greater that 101 F, purulent drainage from wound, increased redness or drainage from wound, foul odor from the wound/dressing, or calf pain - CONTACT YOUR SURGEON.                                                   FOLLOW-UP APPOINTMENTS Make sure you keep all of your appointments after your operation with your surgeon and caregivers. You should call the office at the above phone number and make an appointment for approximately two weeks after the date of your surgery or on the date instructed by your surgeon outlined in the After Visit Summary.  RANGE OF MOTION AND STRENGTHENING EXERCISES  Rehabilitation of the knee is important following a knee injury or an operation. After just a few days of immobilization, the muscles of the thigh which control the knee become weakened and shrink (atrophy). Knee exercises are designed to build up the tone and strength of the thigh muscles and to improve knee  motion. Often times heat used for twenty to thirty minutes before working out will loosen up your tissues and help with improving the range of motion but do not use heat for the first two weeks following surgery. These exercises can be done on a training (exercise) mat, on the floor, on a table or on a bed. Use what ever works the best and is most comfortable for you Knee exercises include:  Leg Lifts - While your knee is still immobilized in a splint or cast, you can do straight leg raises. Lift the leg to 60 degrees, hold for 3 sec, and slowly lower the leg. Repeat 10-20 times 2-3 times daily. Perform this exercise against resistance later as your knee gets better.  Quad and Hamstring Sets - Tighten up the muscle on the front of the thigh (Quad) and hold for 5-10 sec. Repeat this 10-20 times hourly. Hamstring sets are done by  pushing the foot backward against an object and holding for 5-10 sec. Repeat as with quad sets.  Leg Slides: Lying on your back, slowly slide your foot toward your buttocks, bending your knee up off the floor (only go as far as is comfortable). Then slowly slide your foot back down until your leg is flat on the floor again. Angel Wings: Lying on your back spread your legs to the side as far apart as you can without causing discomfort.  A rehabilitation program following serious knee injuries can speed recovery and prevent re-injury in the future due to weakened muscles. Contact your doctor or a physical therapist for more information on knee rehabilitation.   POST-OPERATIVE OPIOID TAPER INSTRUCTIONS: It is important to wean off of your opioid medication as soon as possible. If you do not need pain medication after your surgery it is ok to stop day one. Opioids include: Codeine, Hydrocodone (Norco, Vicodin), Oxycodone (Percocet, oxycontin ) and hydromorphone amongst others.  Long term and even short term use of opiods can cause: Increased pain  response Dependence Constipation Depression Respiratory depression And more.  Withdrawal symptoms can include Flu like symptoms Nausea, vomiting And more Techniques to manage these symptoms Hydrate well Eat regular healthy meals Stay active Use relaxation techniques(deep breathing, meditating, yoga) Do Not substitute Alcohol to help with tapering If you have been on opioids for less than two weeks and do not have pain than it is ok to stop all together.  Plan to wean off of opioids This plan should start within one week post op of your joint replacement. Maintain the same interval or time between taking each dose and first decrease the dose.  Cut the total daily intake of opioids by one tablet each day Next start to increase the time between doses. The last dose that should be eliminated is the evening dose.   MAKE SURE YOU:  Understand these instructions.  Get help right away if you are not doing well or get worse.   If you have one of these conditions, contact your surgeon for an antibiotic prescription, prior to your dental procedure.    Pick up stool softner and laxative for home use following surgery while on pain medications. Do not submerge incision under water. Please use good hand washing techniques while changing dressing each day. May shower starting three days after surgery. Please use a clean towel to pat the incision dry following showers. Continue to use ice for pain and swelling after surgery. Do not use any lotions or creams on the incision until instructed by your surgeon.

## 2023-12-16 NOTE — Brief Op Note (Signed)
 12/16/2023  2:15 PM  PATIENT:  Nicole Charles  48 y.o. female  PRE-OPERATIVE DIAGNOSIS:  Right knee arthrofibrosis  POST-OPERATIVE DIAGNOSIS:  Right knee arthrofibrosis  PROCEDURE:  Procedure(s): MANIPULATION, KNEE, CLOSED (Right)  SURGEON:  Surgeons and Role:    DEWAINE Melodi Lerner, MD - Primary  PHYSICIAN ASSISTANT:   ASSISTANTS: none   ANESTHESIA:   general  EBL:  none  BLOOD ADMINISTERED:none  DRAINS: none   LOCAL MEDICATIONS USED:  NONE  COUNTS:  YES  TOURNIQUET:  * No tourniquets in log *  DICTATION: .Other Dictation: Dictation Number [id number]  PLAN OF CARE: Discharge to home after PACU  PATIENT DISPOSITION:  PACU - hemodynamically stable.

## 2023-12-17 ENCOUNTER — Encounter (HOSPITAL_COMMUNITY): Payer: Self-pay | Admitting: Orthopedic Surgery

## 2023-12-17 NOTE — Op Note (Signed)
 NAMEJAYDYNN, Nicole Charles MEDICAL RECORD NO: [id number] ACCOUNT NO: [id number] DATE OF BIRTH: 04-Feb-1976 FACILITY: [facility] LOCATION: [facility] PHYSICIAN: Dempsey GAILS. Evalisse Prajapati, MD  Operative Report   DATE OF PROCEDURE: 12/16/2023   PREOPERATIVE DIAGNOSIS: Right knee arthrofibrosis.  POSTOPERATIVE DIAGNOSIS: Right knee arthrofibrosis.  PROCEDURE PERFORMED: Right knee closed manipulation.  SURGEON: Dempsey GAILS. Taraann Olthoff, MD  ASSISTANT: None.  ANESTHESIA: General IV sedation.  ESTIMATED BLOOD LOSS: None.  COMPLICATIONS: None.  CONDITION: Stable.  BRIEF CLINICAL NOTE: The patient is a 48 year old female who had a right knee femoral revision about a year ago. She has had problems with regaining range of motion since. She has had extensive physical therapy. Flexion is stuck at about 80 degrees with  a 15-degree flexion contracture. She presents now for attempted closed manipulation.  DESCRIPTION OF PROCEDURE: After successful administration of heavy IV sedation, exam under anesthesia shows range of motion 15-80. I then placed my chest up against her proximal tibia flexing the knee with no audible lysis of adhesions. I was able to get  her to about 95 degrees, but not beyond that. I was able to get the extension out to slightly less than 10 degrees, so an improvement of about 5 degrees. I attempted moving her again with the same endpoints of motion. I did not want to push harder for  fear of causing a periprosthetic fracture. She was subsequently awakened and transported to recovery in stable condition.     SUJ D: 12/16/2023 2:19:44 pm T: 12/17/2023 12:09:00 am  JOB: [id number]/ [id number]

## 2024-02-13 ENCOUNTER — Encounter (HOSPITAL_COMMUNITY): Payer: Self-pay

## 2024-02-13 NOTE — Progress Notes (Signed)
 Date of COVID positive in last 90 days:  PCP - Berwyn Pizza, FNP Cardiologist -   Chest x-ray - N/A EKG - N/A Stress Test - N/A ECHO - N/A Cardiac Cath - N/A Coronary CT - 09-01-20 Epic Pacemaker/ICD device last checked:N/A Spinal Cord Stimulator:N/A  Bowel Prep - N/A  Sleep Study - N/A CPAP -   Fasting Blood Sugar - N/A Checks Blood Sugar _____ times a day  Last dose of GLP1 agonist-  N/A GLP1 instructions:  Do not take after     Last dose of SGLT-2 inhibitors-  N/A SGLT-2 instructions:  Do not take after     Blood Thinner Instructions: N/A Last dose:   Time: Aspirin  Instructions:N/A Last Dose:  Activity level:  Can go up a flight of stairs and perform activities of daily living without stopping and without symptoms of chest pain or shortness of breath.  Able to exercise without symptoms  Unable to go up a flight of stairs without symptoms of     Anesthesia review: N/A  Patient denies shortness of breath, fever, cough and chest pain at PAT appointment  Patient verbalized understanding of instructions that were given to them at the PAT appointment. Patient was also instructed that they will need to review over the PAT instructions again at home before surgery.

## 2024-02-13 NOTE — Patient Instructions (Addendum)
 SURGICAL WAITING ROOM VISITATION Patients having surgery or a procedure may have no more than 2 support people in the waiting area - these visitors may rotate.    Children under the age of 90 will not be allowed to visit due to the increase in respiratory illness  Children under the age of 63 must have an adult with them who is not the patient.  If the patient needs to stay at the hospital during part of their recovery, the visitor guidelines for inpatient rooms apply. Pre-op nurse will coordinate an appropriate time for 1 support person to accompany patient in pre-op.  This support person may not rotate.    Please refer to the [facility] website for the visitor guidelines for Inpatients (after your surgery is over and you are in a regular room).       Your procedure is scheduled on: 03-02-24   Report to [facility] Main Entrance    Report to admitting at 12:45 PM   Call this number if you have problems the morning of surgery [phone]   Do not eat food :After Midnight.   After Midnight you may have the following liquids until 12:00 PM (noon)  DAY OF SURGERY  Water Non-Citrus Juices (without pulp, NO RED-Apple, White grape, White cranberry) Black Coffee (NO MILK/CREAM OR CREAMERS, sugar ok)  Clear Tea (NO MILK/CREAM OR CREAMERS, sugar ok) regular and decaf                             Plain Jell-O (NO RED)                                           Fruit ices (not with fruit pulp, NO RED)                                     Popsicles (NO RED)                                                               Sports drinks like Gatorade (NO RED)                   The day of surgery:  Drink ONE (1) Pre-Surgery Clear Ensure  by 12:00 PM the morning of surgery. Drink in one sitting. Do not sip.  This drink was given to you during your hospital  pre-op appointment visit. Nothing else to drink after completing the Pre-Surgery Clear Ensure.          If you have questions,  please contact your surgeons office.   FOLLOW ANY ADDITIONAL PRE OP INSTRUCTIONS YOU RECEIVED FROM YOUR SURGEON'S OFFICE!!!     Oral Hygiene is also important to reduce your risk of infection.                                    Remember - BRUSH YOUR TEETH THE MORNING OF SURGERY WITH YOUR REGULAR TOOTHPASTE   Do NOT smoke after  Midnight   Take these medicines the morning of surgery with A SIP OF WATER:    Amlodipine    Carvedilol    Pantoprazole   Stop all vitamins and herbal supplements 7 days before surgery  Bring CPAP mask and tubing day of surgery.                              You may not have any metal on your body including hair pins, jewelry, and body piercing             Do not wear make-up, lotions, powders, perfume, or deodorant  Do not wear nail polish including gel and S&S, artificial/acrylic nails, or any other type of covering on natural nails including finger and toenails. If you have artificial nails, gel coating, etc. that needs to be removed by a nail salon please have this removed prior to surgery or surgery may need to be canceled/ delayed if the surgeon/ anesthesia feels like they are unable to be safely monitored.   Do not shave  48 hours prior to surgery.          Do not bring valuables to the hospital. [facility] IS NOT RESPONSIBLE   FOR VALUABLES.   Contacts, dentures or bridgework may not be worn into surgery.  DO NOT BRING YOUR HOME MEDICATIONS TO THE HOSPITAL. PHARMACY WILL DISPENSE MEDICATIONS LISTED ON YOUR MEDICATION LIST TO YOU DURING YOUR ADMISSION IN THE HOSPITAL!    Patients discharged on the day of surgery will not be allowed to drive home.  Someone NEEDS to stay with you for the first 24 hours after anesthesia.   Special Instructions: Bring a copy of your healthcare power of attorney and living will documents the day of surgery if you haven't scanned them before.              Please read over the following fact sheets you were given: IF YOU  HAVE QUESTIONS ABOUT YOUR PRE-OP INSTRUCTIONS PLEASE CALL [phone] [facility]  If you received a COVID test during your pre-op visit  it is requested that you wear a mask when out in public, stay away from anyone that may not be feeling well and notify your surgeon if you develop symptoms. If you test positive for Covid or have been in contact with anyone that has tested positive in the last 10 days please notify you surgeon.  [facility] - Preparing for Surgery Before surgery, you can play an important role.  Because skin is not sterile, your skin needs to be as free of germs as possible.  You can reduce the number of germs on your skin by washing with CHG (chlorahexidine gluconate) soap before surgery.  CHG is an antiseptic cleaner which kills germs and bonds with the skin to continue killing germs even after washing. Please DO NOT use if you have an allergy to CHG or antibacterial soaps.  If your skin becomes reddened/irritated stop using the CHG and inform your nurse when you arrive at Short Stay. Do not shave (including legs and underarms) for at least 48 hours prior to the first CHG shower.  You may shave your face/neck.  Please follow these instructions carefully:  1.  Shower with CHG Soap the night before surgery ONLY (DO NOT USE THE SOAP THE MORNING OF SURGERY).  2.  If you choose to wash your hair, wash your hair first as usual with your normal  shampoo.  3.  After you shampoo, rinse your hair and body thoroughly to remove the shampoo.                             4.  Use CHG as you would any other liquid soap.  You can apply chg directly to the skin and wash.  Gently with a scrungie or clean washcloth.  5.  Apply the CHG Soap to your body ONLY FROM THE NECK DOWN.   Do   not use on face/ open                           Wound or open sores. Avoid contact with eyes, ears mouth and   genitals (private parts).                       Wash face,  Genitals (private parts) with your normal soap.              6.  Wash thoroughly, paying special attention to the area where your    surgery  will be performed.  7.  Thoroughly rinse your body with warm water from the neck down.  8.  DO NOT shower/wash with your normal soap after using and rinsing off the CHG Soap.                9.  Pat yourself dry with a clean towel.            10.  Wear clean pajamas.            11.  Place clean sheets on your bed the night of your first shower and do not  sleep with pets. Day of Surgery :  Use Regular Soap Do not apply any CHG, lotions/deodorants the morning of surgery.  Please wear clean clothes to the hospital/surgery center.  FAILURE TO FOLLOW THESE INSTRUCTIONS MAY RESULT IN THE CANCELLATION OF YOUR SURGERY  PATIENT SIGNATURE_________________________________  NURSE SIGNATURE__________________________________  ________________________________________________________________________

## 2024-02-17 ENCOUNTER — Encounter (HOSPITAL_COMMUNITY): Payer: Self-pay

## 2024-02-17 ENCOUNTER — Encounter (HOSPITAL_COMMUNITY)
Admission: RE | Admit: 2024-02-17 | Discharge: 2024-02-17 | Disposition: A | Discharge status: Home / Self Care | Source: Ambulatory Visit | Attending: Orthopedic Surgery | Admitting: Orthopedic Surgery

## 2024-02-17 ENCOUNTER — Other Ambulatory Visit: Payer: Self-pay

## 2024-02-17 VITALS — BP 162/106 | HR 68 | Temp 98.3°F | Resp 12 | Ht 63.0 in | Wt 140.4 lb

## 2024-02-17 DIAGNOSIS — I10 Essential (primary) hypertension: Secondary | ICD-10-CM | POA: Diagnosis not present

## 2024-02-17 DIAGNOSIS — Z01818 Encounter for other preprocedural examination: Secondary | ICD-10-CM | POA: Insufficient documentation

## 2024-02-17 DIAGNOSIS — D649 Anemia, unspecified: Secondary | ICD-10-CM | POA: Diagnosis not present

## 2024-02-17 HISTORY — DX: Anemia, unspecified: D64.9

## 2024-02-17 LAB — BASIC METABOLIC PANEL WITH GFR
Anion gap: 8 (ref 5–15)
BUN: 12 mg/dL (ref 6–20)
CO2: 28 mmol/L (ref 22–32)
Calcium: 9.5 mg/dL (ref 8.9–10.3)
Chloride: 102 mmol/L (ref 98–111)
Creatinine, Ser: 0.71 mg/dL (ref 0.44–1.00)
GFR, Estimated: >60 mL/min
Glucose, Bld: 95 mg/dL (ref 70–99)
Potassium: 3.6 mmol/L (ref 3.5–5.1)
Sodium: 139 mmol/L (ref 135–145)

## 2024-02-17 LAB — CBC
HCT: 37.8 % (ref 36.0–46.0)
Hemoglobin: 12.1 g/dL (ref 12.0–15.0)
MCH: 31.5 pg (ref 26.0–34.0)
MCHC: 32 g/dL (ref 30.0–36.0)
MCV: 98.4 fL (ref 80.0–100.0)
Platelets: 255 K/uL (ref 150–400)
RBC: 3.84 MIL/uL — ABNORMAL LOW (ref 3.87–5.11)
RDW: 13.4 % (ref 11.5–15.5)
WBC: 4.1 K/uL (ref 4.0–10.5)
nRBC: 0 % (ref 0.0–0.2)

## 2024-03-02 ENCOUNTER — Ambulatory Visit (HOSPITAL_COMMUNITY): Admitting: Anesthesiology

## 2024-03-02 ENCOUNTER — Encounter (HOSPITAL_COMMUNITY)
Admission: RE | Disposition: A | Discharge status: Home / Self Care | Payer: Self-pay | Source: Ambulatory Visit | Attending: Orthopedic Surgery

## 2024-03-02 ENCOUNTER — Encounter (HOSPITAL_COMMUNITY): Payer: Self-pay | Admitting: Orthopedic Surgery

## 2024-03-02 ENCOUNTER — Ambulatory Visit (HOSPITAL_COMMUNITY)
Admission: RE | Admit: 2024-03-02 | Discharge: 2024-03-02 | Disposition: A | Discharge status: Home / Self Care | Source: Ambulatory Visit | Attending: Orthopedic Surgery | Admitting: Orthopedic Surgery

## 2024-03-02 ENCOUNTER — Other Ambulatory Visit: Payer: Self-pay

## 2024-03-02 ENCOUNTER — Ambulatory Visit (HOSPITAL_BASED_OUTPATIENT_CLINIC_OR_DEPARTMENT_OTHER): Admitting: Anesthesiology

## 2024-03-02 DIAGNOSIS — R519 Headache, unspecified: Secondary | ICD-10-CM | POA: Diagnosis not present

## 2024-03-02 DIAGNOSIS — K219 Gastro-esophageal reflux disease without esophagitis: Secondary | ICD-10-CM | POA: Insufficient documentation

## 2024-03-02 DIAGNOSIS — M24661 Ankylosis, right knee: Secondary | ICD-10-CM | POA: Insufficient documentation

## 2024-03-02 DIAGNOSIS — D649 Anemia, unspecified: Secondary | ICD-10-CM | POA: Diagnosis not present

## 2024-03-02 DIAGNOSIS — M1990 Unspecified osteoarthritis, unspecified site: Secondary | ICD-10-CM | POA: Insufficient documentation

## 2024-03-02 DIAGNOSIS — I10 Essential (primary) hypertension: Secondary | ICD-10-CM | POA: Insufficient documentation

## 2024-03-02 DIAGNOSIS — M24669 Ankylosis, unspecified knee: Secondary | ICD-10-CM | POA: Diagnosis present

## 2024-03-02 DIAGNOSIS — Z79899 Other long term (current) drug therapy: Secondary | ICD-10-CM | POA: Insufficient documentation

## 2024-03-02 HISTORY — PX: KNEE ARTHROTOMY: SHX5881

## 2024-03-02 LAB — POCT PREGNANCY, URINE: Preg Test, Ur: NEGATIVE

## 2024-03-02 SURGERY — ARTHROTOMY, KNEE
Anesthesia: General | Site: Knee | Laterality: Right

## 2024-03-02 MED ORDER — METHOCARBAMOL 500 MG PO TABS: 500.0000 mg | ORAL_TABLET | Freq: Four times a day (QID) | ORAL | 0 refills | Status: AC | PRN

## 2024-03-02 MED ORDER — OXYCODONE HCL 5 MG PO TABS
ORAL_TABLET | ORAL | Status: AC
  Filled 2024-03-02: qty 1

## 2024-03-02 MED ORDER — MIDAZOLAM HCL (PF) 2 MG/2ML IJ SOLN
INTRAMUSCULAR | Status: DC | PRN
  Administered 2024-03-02: 2 mg via INTRAVENOUS

## 2024-03-02 MED ORDER — DEXAMETHASONE SOD PHOSPHATE PF 10 MG/ML IJ SOLN
8.0000 mg | Freq: Once | INTRAMUSCULAR | Status: AC
  Administered 2024-03-02: 8 mg via INTRAVENOUS

## 2024-03-02 MED ORDER — TRANEXAMIC ACID-NACL 1000-0.7 MG/100ML-% IV SOLN
1000.0000 mg | INTRAVENOUS | Status: AC
  Administered 2024-03-02: 1000 mg via INTRAVENOUS
  Filled 2024-03-02: qty 100

## 2024-03-02 MED ORDER — FENTANYL CITRATE (PF) 50 MCG/ML IJ SOSY
PREFILLED_SYRINGE | INTRAMUSCULAR | Status: AC
  Filled 2024-03-02: qty 3

## 2024-03-02 MED ORDER — ORAL CARE MOUTH RINSE: 15.0000 mL | Freq: Once | OROMUCOSAL | Status: AC

## 2024-03-02 MED ORDER — HYDROCODONE-ACETAMINOPHEN 5-325 MG PO TABS: 1.0000 | ORAL_TABLET | Freq: Four times a day (QID) | ORAL | 0 refills | Status: AC | PRN

## 2024-03-02 MED ORDER — BUPIVACAINE LIPOSOME 1.3 % IJ SUSP: 20.0000 mL | Freq: Once | INTRAMUSCULAR | Status: DC

## 2024-03-02 MED ORDER — 0.9 % SODIUM CHLORIDE (POUR BTL) OPTIME
TOPICAL | Status: DC | PRN
  Administered 2024-03-02: 1000 mL

## 2024-03-02 MED ORDER — FENTANYL CITRATE (PF) 100 MCG/2ML IJ SOLN
INTRAMUSCULAR | Status: AC
  Filled 2024-03-02: qty 2

## 2024-03-02 MED ORDER — MIDAZOLAM HCL 2 MG/2ML IJ SOLN
INTRAMUSCULAR | Status: AC
  Filled 2024-03-02: qty 2

## 2024-03-02 MED ORDER — LIDOCAINE HCL (PF) 2 % IJ SOLN
INTRAMUSCULAR | Status: AC
  Filled 2024-03-02: qty 5

## 2024-03-02 MED ORDER — BUPIVACAINE HCL (PF) 0.25 % IJ SOLN
INTRAMUSCULAR | Status: AC
  Filled 2024-03-02: qty 30

## 2024-03-02 MED ORDER — ONDANSETRON HCL 4 MG/2ML IJ SOLN
INTRAMUSCULAR | Status: DC | PRN
  Administered 2024-03-02: 4 mg via INTRAVENOUS

## 2024-03-02 MED ORDER — FENTANYL CITRATE (PF) 100 MCG/2ML IJ SOLN
INTRAMUSCULAR | Status: DC | PRN
  Administered 2024-03-02 (×2): 50 ug via INTRAVENOUS

## 2024-03-02 MED ORDER — PROPOFOL 10 MG/ML IV BOLUS
INTRAVENOUS | Status: DC | PRN
  Administered 2024-03-02: 150 mg via INTRAVENOUS

## 2024-03-02 MED ORDER — ACETAMINOPHEN 10 MG/ML IV SOLN
1000.0000 mg | Freq: Once | INTRAVENOUS | Status: AC
  Administered 2024-03-02: 1000 mg via INTRAVENOUS
  Filled 2024-03-02: qty 100

## 2024-03-02 MED ORDER — ACETAMINOPHEN 10 MG/ML IV SOLN: 1000.0000 mg | Freq: Once | INTRAVENOUS | Status: DC | PRN

## 2024-03-02 MED ORDER — LACTATED RINGERS IV SOLN: INTRAVENOUS | Status: DC

## 2024-03-02 MED ORDER — OXYCODONE HCL 5 MG/5ML PO SOLN: 5.0000 mg | Freq: Once | ORAL | Status: AC | PRN

## 2024-03-02 MED ORDER — MIDAZOLAM HCL (PF) 2 MG/2ML IJ SOLN
1.0000 mg | INTRAMUSCULAR | Status: DC
  Administered 2024-03-02: 2 mg via INTRAVENOUS
  Filled 2024-03-02: qty 2

## 2024-03-02 MED ORDER — FENTANYL CITRATE (PF) 50 MCG/ML IJ SOSY
25.0000 ug | PREFILLED_SYRINGE | INTRAMUSCULAR | Status: DC | PRN
  Administered 2024-03-02 (×3): 50 ug via INTRAVENOUS

## 2024-03-02 MED ORDER — LIDOCAINE HCL (CARDIAC) PF 100 MG/5ML IV SOSY
PREFILLED_SYRINGE | INTRAVENOUS | Status: DC | PRN
  Administered 2024-03-02: 60 mg via INTRAVENOUS

## 2024-03-02 MED ORDER — ASPIRIN 81 MG PO TBEC: 81.0000 mg | DELAYED_RELEASE_TABLET | Freq: Every day | ORAL | 0 refills | Status: AC

## 2024-03-02 MED ORDER — POVIDONE-IODINE 10 % EX SWAB: 2.0000 | Freq: Once | CUTANEOUS | Status: DC

## 2024-03-02 MED ORDER — FENTANYL CITRATE (PF) 50 MCG/ML IJ SOSY
50.0000 ug | PREFILLED_SYRINGE | INTRAMUSCULAR | Status: DC
  Administered 2024-03-02: 50 ug via INTRAVENOUS
  Filled 2024-03-02: qty 2

## 2024-03-02 MED ORDER — HYDRALAZINE HCL 20 MG/ML IJ SOLN
10.0000 mg | Freq: Once | INTRAMUSCULAR | Status: AC
  Administered 2024-03-02: 10 mg via INTRAVENOUS

## 2024-03-02 MED ORDER — CHLORHEXIDINE GLUCONATE 0.12 % MT SOLN
15.0000 mL | Freq: Once | OROMUCOSAL | Status: AC
  Administered 2024-03-02: 15 mL via OROMUCOSAL

## 2024-03-02 MED ORDER — ONDANSETRON HCL 4 MG/2ML IJ SOLN
INTRAMUSCULAR | Status: AC
  Filled 2024-03-02: qty 2

## 2024-03-02 MED ORDER — CEFAZOLIN SODIUM-DEXTROSE 2-4 GM/100ML-% IV SOLN
2.0000 g | INTRAVENOUS | Status: AC
  Administered 2024-03-02: 2 g via INTRAVENOUS
  Filled 2024-03-02: qty 100

## 2024-03-02 MED ORDER — PROPOFOL 10 MG/ML IV BOLUS
INTRAVENOUS | Status: AC
  Filled 2024-03-02: qty 20

## 2024-03-02 MED ORDER — OXYCODONE HCL 5 MG PO TABS
5.0000 mg | ORAL_TABLET | Freq: Once | ORAL | Status: AC | PRN
  Administered 2024-03-02: 5 mg via ORAL

## 2024-03-02 MED ORDER — ONDANSETRON HCL 4 MG/2ML IJ SOLN: 4.0000 mg | Freq: Once | INTRAMUSCULAR | Status: DC | PRN

## 2024-03-02 MED ORDER — HYDRALAZINE HCL 20 MG/ML IJ SOLN
INTRAMUSCULAR | Status: AC
  Filled 2024-03-02: qty 1

## 2024-03-02 SURGICAL SUPPLY — 27 items
BAG COUNTER SPONGE SURGICOUNT (BAG) IMPLANT
BAG ZIPLOCK 12X15 (MISCELLANEOUS) ×1 IMPLANT
BNDG ELASTIC 6INX 5YD STR LF (GAUZE/BANDAGES/DRESSINGS) ×1 IMPLANT
BNDG ELASTIC 6X10 VLCR STRL LF (GAUZE/BANDAGES/DRESSINGS) IMPLANT
COVER SURGICAL LIGHT HANDLE (MISCELLANEOUS) ×1 IMPLANT
CUFF TRNQT CYL 34X4.125X (TOURNIQUET CUFF) IMPLANT
DERMABOND ADVANCED .7 DNX12 (GAUZE/BANDAGES/DRESSINGS) ×1 IMPLANT
DRAPE U-SHAPE 47X51 STRL (DRAPES) ×1 IMPLANT
DRSG AQUACEL AG ADV 3.5X10 (GAUZE/BANDAGES/DRESSINGS) ×1 IMPLANT
DURAPREP 26ML APPLICATOR (WOUND CARE) ×1 IMPLANT
ELECT REM PT RETURN 15FT ADLT (MISCELLANEOUS) ×1 IMPLANT
EVACUATOR 1/8 PVC DRAIN (DRAIN) IMPLANT
GLOVE BIO SURGEON STRL SZ 6.5 (GLOVE) ×1 IMPLANT
GLOVE BIO SURGEON STRL SZ7 (GLOVE) ×1 IMPLANT
GLOVE BIO SURGEON STRL SZ8 (GLOVE) ×1 IMPLANT
GLOVE BIOGEL PI IND STRL 7.0 (GLOVE) ×1 IMPLANT
GLOVE BIOGEL PI IND STRL 8 (GLOVE) ×1 IMPLANT
GOWN STRL REUS W/ TWL LRG LVL3 (GOWN DISPOSABLE) ×3 IMPLANT
KIT TURNOVER KIT A (KITS) ×1 IMPLANT
MANIFOLD NEPTUNE II (INSTRUMENTS) ×1 IMPLANT
PACK TOTAL KNEE CUSTOM (KITS) ×1 IMPLANT
PADDING CAST COTTON 6X4 STRL (CAST SUPPLIES) IMPLANT
PROTECTOR NERVE ULNAR (MISCELLANEOUS) ×1 IMPLANT
STRIP CLOSURE SKIN 1/2X4 (GAUZE/BANDAGES/DRESSINGS) ×2 IMPLANT
SUT MNCRL AB 4-0 PS2 18 (SUTURE) ×1 IMPLANT
SUT VIC AB 2-0 CT1 TAPERPNT 27 (SUTURE) ×2 IMPLANT
SUTURE STRATFX 0 PDS 27 VIOLET (SUTURE) ×1 IMPLANT

## 2024-03-02 NOTE — Discharge Instructions (Addendum)
 "   Nicole Moan, MD Total [facility] Specialist [facility] [address], [address] [place], [place] [address] [phone]  TOTAL KNEE REPLACEMENT POSTOPERATIVE DIRECTIONS  HOME CARE INSTRUCTIONS  Remove items at home which could result in a fall. This includes throw rugs or furniture in walking pathways.  ICE to the affected knee as much as tolerated. Icing helps control swelling. If the swelling is well controlled you will be more comfortable and rehab easier. Continue to use ice on the knee for pain and swelling from surgery. You may notice swelling that will progress down to the foot and ankle. This is normal after surgery. Elevate the leg when you are not up walking on it.    Continue to use the breathing machine which will help keep your temperature down. It is common for your temperature to cycle up and down following surgery, especially at night when you are not up moving around and exerting yourself. The breathing machine keeps your lungs expanded and your temperature down. Do not place pillow under the operative knee, focus on keeping the knee straight while resting  DIET You may resume your previous home diet once you are discharged from the hospital.  DRESSING / WOUND CARE / SHOWERING Keep your bulky bandage on for 2 days. On the third post-operative day you may remove the Ace bandage and gauze. There is a waterproof adhesive bandage on your skin which will stay in place until your first follow-up appointment. Once you remove this you will not need to place another bandage You may begin showering 3 days following surgery, but do not submerge the incision under water.  ACTIVITY For the first 5 days, the key is rest and control of pain and swelling Do your home exercises twice a day starting on post-operative day 3. On the days you go to physical therapy, just do the home exercises once that day. You should rest, ice and elevate the leg for 50 minutes out of every  hour. Get up and walk/stretch for 10 minutes per hour. After 5 days you can increase your activity slowly as tolerated. Walk with your walker as instructed. Use the walker until you are comfortable transitioning to a cane. Walk with the cane in the opposite hand of the operative leg. You may discontinue the cane once you are comfortable and walking steadily. Avoid periods of inactivity such as sitting longer than an hour when not asleep. This helps prevent blood clots.  You may discontinue the knee immobilizer once you are able to perform a straight leg raise while lying down. You may resume a sexual relationship in one month or when given the OK by your doctor.  You may return to work once you are cleared by your doctor.  Do not drive a car for 6 weeks or until released by your surgeon.  Do not drive while taking narcotics.  TED HOSE STOCKINGS Wear the elastic stockings on both legs for three weeks following surgery during the day. You may remove them at night for sleeping.  WEIGHT BEARING Weight bearing as tolerated with assist device (walker, cane, etc) as directed, use it as long as suggested by your surgeon or therapist, typically at least 4-6 weeks.  POSTOPERATIVE CONSTIPATION PROTOCOL Constipation - defined medically as fewer than three stools per week and severe constipation as less than one stool per week.  One of the most common issues patients have following surgery is constipation.  Even if you have a regular bowel pattern  at home, your normal regimen is likely to be disrupted due to multiple reasons following surgery.  Combination of anesthesia, postoperative narcotics, change in appetite and fluid intake all can affect your bowels.  In order to avoid complications following surgery, here are some recommendations in order to help you during your recovery period.  Colace (docusate) - Pick up an over-the-counter form of Colace or another stool softener and take twice a day as long as  you are requiring postoperative pain medications.  Take with a full glass of water daily.  If you experience loose stools or diarrhea, hold the colace until you stool forms back up. If your symptoms do not get better within 1 week or if they get worse, check with your doctor. Dulcolax (bisacodyl ) - Pick up over-the-counter and take as directed by the product packaging as needed to assist with the movement of your bowels.  Take with a full glass of water.  Use this product as needed if not relieved by Colace only.  MiraLax  (polyethylene glycol) - Pick up over-the-counter to have on hand. MiraLax  is a solution that will increase the amount of water in your bowels to assist with bowel movements.  Take as directed and can mix with a glass of water, juice, soda, coffee, or tea. Take if you go more than two days without a movement. Do not use MiraLax  more than once per day. Call your doctor if you are still constipated or irregular after using this medication for 7 days in a row.  If you continue to have problems with postoperative constipation, please contact the office for further assistance and recommendations.  If you experience the worst abdominal pain ever or develop nausea or vomiting, please contact the office immediatly for further recommendations for treatment.  ITCHING If you experience itching with your medications, try taking only a single pain pill, or even half a pain pill at a time.  You can also use Benadryl  over the counter for itching or also to help with sleep.   MEDICATIONS See your medication summary on the After Visit Summary that the nursing staff will review with you prior to discharge.  You may have some home medications which will be placed on hold until you complete the course of blood thinner medication.  It is important for you to complete the blood thinner medication as prescribed by your surgeon.  Continue your approved medications as instructed at time of  discharge.  PRECAUTIONS If you experience chest pain or shortness of breath - call 911 immediately for transfer to the hospital emergency department.  If you develop a fever greater that 101 F, purulent drainage from wound, increased redness or drainage from wound, foul odor from the wound/dressing, or calf pain - CONTACT YOUR SURGEON.                                                   FOLLOW-UP APPOINTMENTS Make sure you keep all of your appointments after your operation with your surgeon and caregivers. You should call the office at the above phone number and make an appointment for approximately two weeks after the date of your surgery or on the date instructed by your surgeon outlined in the After Visit Summary.  RANGE OF MOTION AND STRENGTHENING EXERCISES  Rehabilitation of the knee is important following a knee injury or an  operation. After just a few days of immobilization, the muscles of the thigh which control the knee become weakened and shrink (atrophy). Knee exercises are designed to build up the tone and strength of the thigh muscles and to improve knee motion. Often times heat used for twenty to thirty minutes before working out will loosen up your tissues and help with improving the range of motion but do not use heat for the first two weeks following surgery. These exercises can be done on a training (exercise) mat, on the floor, on a table or on a bed. Use what ever works the best and is most comfortable for you Knee exercises include:  Leg Lifts - While your knee is still immobilized in a splint or cast, you can do straight leg raises. Lift the leg to 60 degrees, hold for 3 sec, and slowly lower the leg. Repeat 10-20 times 2-3 times daily. Perform this exercise against resistance later as your knee gets better.  Quad and Hamstring Sets - Tighten up the muscle on the front of the thigh (Quad) and hold for 5-10 sec. Repeat this 10-20 times hourly. Hamstring sets are done by pushing the  foot backward against an object and holding for 5-10 sec. Repeat as with quad sets.  Leg Slides: Lying on your back, slowly slide your foot toward your buttocks, bending your knee up off the floor (only go as far as is comfortable). Then slowly slide your foot back down until your leg is flat on the floor again. Angel Wings: Lying on your back spread your legs to the side as far apart as you can without causing discomfort.  A rehabilitation program following serious knee injuries can speed recovery and prevent re-injury in the future due to weakened muscles. Contact your doctor or a physical therapist for more information on knee rehabilitation.   POST-OPERATIVE OPIOID TAPER INSTRUCTIONS: It is important to wean off of your opioid medication as soon as possible. If you do not need pain medication after your surgery it is ok to stop day one. Opioids include: Codeine, Hydrocodone (Norco, Vicodin), Oxycodone (Percocet, oxycontin ) and hydromorphone  amongst others.  Long term and even short term use of opiods can cause: Increased pain response Dependence Constipation Depression Respiratory depression And more.  Withdrawal symptoms can include Flu like symptoms Nausea, vomiting And more Techniques to manage these symptoms Hydrate well Eat regular healthy meals Stay active Use relaxation techniques(deep breathing, meditating, yoga) Do Not substitute Alcohol to help with tapering If you have been on opioids for less than two weeks and do not have pain than it is ok to stop all together.  Plan to wean off of opioids This plan should start within one week post op of your joint replacement. Maintain the same interval or time between taking each dose and first decrease the dose.  Cut the total daily intake of opioids by one tablet each day Next start to increase the time between doses. The last dose that should be eliminated is the evening dose.   MAKE SURE YOU:  Understand these instructions.   Get help right away if you are not doing well or get worse.    Pick up stool softner and laxative for home use following surgery while on pain medications. Do not submerge incision under water. Please use good hand washing techniques while changing dressing each day. May shower starting three days after surgery. Please use a clean towel to pat the incision dry following showers. Continue to use ice for pain  and swelling after surgery. Do not use any lotions or creams on the incision until instructed by your surgeon.  "

## 2024-03-02 NOTE — Brief Op Note (Signed)
 03/02/2024  3:19 PM  PATIENT:  Nicole Charles  49 y.o. female  PRE-OPERATIVE DIAGNOSIS:  Right knee arthrofibrosis  POST-OPERATIVE DIAGNOSIS:  Right knee arthrofibrosis  PROCEDURE:  Procedures with comments: ARTHROTOMY, KNEE (Right) - Rightknee arthrotomy and scar excision  SURGEON:  Surgeons and Role:    DEWAINE Melodi Lerner, MD - Primary  PHYSICIAN ASSISTANT:   ASSISTANTS: Waddell Sor, PA-C   ANESTHESIA:   regional and general  EBL:  20 mL   BLOOD ADMINISTERED:none  DRAINS: none   LOCAL MEDICATIONS USED:  NONE  COUNTS:  YES  TOURNIQUET:   Total Tourniquet Time Documented: Thigh (Right) - 21 minutes Total: Thigh (Right) - 21 minutes   DICTATION: .Other Dictation: Dictation Number  [id number]  PLAN OF CARE: Discharge to home after PACU  PATIENT DISPOSITION:  PACU - hemodynamically stable.   Delay start of Pharmacological VTE agent (>24hrs) due to surgical blood loss or risk of bleeding: yes

## 2024-03-02 NOTE — H&P (Signed)
 H&P  Subjective:  HPI: Lela Gearing, 49 y.o. female presents for a post-operative evaluation following a right knee revision surgery complicated by arthrofibrosis. She reports no significant changes in her condition since the last visit. Her knee flexion remains limited, with range of motion approximately 14 to 85 degrees. Extension has shown slight improvement, progressing from approximately 20 degrees to slightly above 10 degrees. She continues to use an extension brace, which appears to be aiding in the improvement of her knee extension. However, her flexion has not improved, and she has been informed that further surgical intervention may be necessary to address the scar tissue limiting her range of motion.  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   Osteoarthritis of right knee 01/02/2023   Knee pain, right anterior 10/22/2018    Past Medical History:  Diagnosis Date   Anemia    Arthritis    GERD (gastroesophageal reflux disease)    Headache    Hypertension 2019    Past Surgical History:  Procedure Laterality Date   EXAM UNDER ANESTHESIA WITH MANIPULATION OF KNEE Right 06/03/2023   Procedure: RIGHT KNEE JOINT MANIPULATION WITH ANESTHESIA;  Surgeon: Melodi Lerner, MD;  Location: [facility];  Service: Orthopedics;  Laterality: Right;   HERNIA REPAIR  2007   umbilical   KNEE ARTHROSCOPY Right 10/22/2018   Procedure: Right knee arthroscopy; chondroplasty, Meniscal Debridemnt;  Surgeon: Melodi Lerner, MD;  Location: [facility];  Service: Orthopedics;  Laterality: Right;    KNEE ARTHROSCOPY W/ DEBRIDEMENT     x3   KNEE CLOSED REDUCTION Right 12/16/2023   Procedure: MANIPULATION, KNEE, CLOSED;  Surgeon: Melodi Lerner, MD;  Location: [facility];  Service: Orthopedics;  Laterality: Right;   TOTAL KNEE REVISION Right 01/02/2023   Procedure: Right total knee arthroplasty femoral revision;   Surgeon: Melodi Lerner, MD;  Location: [facility];  Service: Orthopedics;  Laterality: Right;   TUBAL LIGATION      Prior to Admission medications  Medication Sig Start Date End Date Taking? Authorizing Provider  amLODipine  (NORVASC ) 2.5 MG tablet Take 2.5 mg by mouth in the morning.   Yes [provider]  carvedilol  (COREG ) 12.5 MG tablet Take 12.5 mg by mouth 2 (two) times daily with a meal.   Yes [provider]  Cholecalciferol (VITAMIN D3 PO) Take 5,000 Units by mouth in the morning.   Yes [provider]  diphenhydramine -acetaminophen  (TYLENOL  PM) 25-500 MG TABS tablet Take 1 tablet by mouth at bedtime as needed (sleep/pain).   Yes [provider]  hydrochlorothiazide  (HYDRODIURIL ) 25 MG tablet Take 25 mg by mouth in the morning.   Yes [provider]  pantoprazole  (PROTONIX ) 40 MG tablet Take 40 mg by mouth daily. 10/09/22  Yes [provider]  Vitamin D, Ergocalciferol, (DRISDOL) 1.25 MG (50000 UNIT) CAPS capsule Take 50,000 Units by mouth every Monday. 11/28/23  Yes [provider]    Allergies[1]  Social History   Socioeconomic History   Marital status: Single    Spouse name: Not on file   Number of children: Not on file   Years of education: Not on file   Highest education level: Not on file  Occupational History   Not on file  Tobacco Use   Smoking status: Never   Smokeless tobacco: Never  Vaping Use   Vaping status: Never Used  Substance and Sexual Activity   Alcohol use: Yes  Comment: Social   Drug use: Never   Sexual activity: Not on file  Other Topics Concern   Not on file  Social History Narrative   Not on file   Social Drivers of Health   Tobacco Use: Low Risk (02/17/2024)   Patient History    Smoking Tobacco Use: Never    Smokeless Tobacco Use: Never    Passive Exposure: Not on file  Financial Resource Strain: Not on file  Food Insecurity: No Food Insecurity (01/02/2023)   Hunger Vital  Sign    Worried About Running Out of Food in the Last Year: Never true    Ran Out of Food in the Last Year: Never true  Transportation Needs: No Transportation Needs (01/02/2023)   PRAPARE - Administrator, Civil Service (Medical): No    Lack of Transportation (Non-Medical): No  Physical Activity: Not on file  Stress: Not on file  Social Connections: Not on file  Intimate Partner Violence: Not At Risk (01/02/2023)   Humiliation, Afraid, Rape, and Kick questionnaire    Fear of Current or Ex-Partner: No    Emotionally Abused: No    Physically Abused: No    Sexually Abused: No  Depression (PHQ2-9): Not on file  Alcohol Screen: Not on file  Housing: Low Risk (01/02/2023)   Housing    Last Housing Risk Score: 0  Utilities: Not At Risk (01/02/2023)   AHC Utilities    Threatened with loss of utilities: No  Health Literacy: Not on file    Tobacco Use: Low Risk (02/17/2024)   Patient History    Smoking Tobacco Use: Never    Smokeless Tobacco Use: Never    Passive Exposure: Not on file   Social History   Substance and Sexual Activity  Alcohol Use Yes   Comment: Social    No family history on file.  Review of Systems  Constitutional:  Negative for chills and fever.  HENT:  Negative for congestion, sore throat and tinnitus.   Eyes:  Negative for double vision, photophobia and pain.  Respiratory:  Negative for cough, shortness of breath and wheezing.   Cardiovascular:  Negative for chest pain, palpitations and orthopnea.  Gastrointestinal:  Negative for heartburn, nausea and vomiting.  Genitourinary:  Negative for dysuria, frequency and urgency.  Musculoskeletal:  Positive for joint pain.  Neurological:  Negative for dizziness, weakness and headaches.    Objective:  Physical Exam: - Female, alert and oriented, in no apparent distress. - Right knee: No effusion. Range of motion approximately 14 to 85 degrees. No instability.  Imaging  Review   Assessment/Plan:  ASSESSMENT:  Nicole Charles is status post right knee revision surgery complicated by arthrofibrosis. Her knee flexion remains severely limited, with no improvement noted. Extension has shown slight improvement, and continued use of the extension brace is recommended to further enhance progress. Surgical intervention involving arthrotomy and scar excision has been discussed in detail to address the flexion limitation.     PLAN:  Surgical intervention is planned to address the arthrofibrosis in the right knee. The procedure will involve arthrotomy and scar excision to remove the scar tissue limiting flexion. This will be scheduled on an outpatient basis, with the goal of improving flexion to approximately 110- 120 degrees.    Roxie Mess, PA-C Orthopedic Surgery [facility]       [1]  Allergies Allergen Reactions   Lisinopril Other (See Comments)    Headache

## 2024-03-02 NOTE — Anesthesia Preprocedure Evaluation (Addendum)
"                                    Anesthesia Evaluation  Patient identified by MRN, date of birth, ID band Patient awake    Reviewed: Allergy & Precautions, NPO status , Patient's Chart, lab work & pertinent test results, reviewed documented beta blocker date and time   Airway Mallampati: II  TM Distance: >3 FB     Dental no notable dental hx.    Pulmonary neg COPD   breath sounds clear to auscultation       Cardiovascular hypertension,  Rhythm:Regular Rate:Normal     Neuro/Psych  Headaches    GI/Hepatic ,GERD  ,,  Endo/Other    Renal/GU      Musculoskeletal  (+) Arthritis , Osteoarthritis,    Abdominal   Peds  Hematology  (+) Blood dyscrasia, anemia   Anesthesia Other Findings   Reproductive/Obstetrics                              Anesthesia Physical Anesthesia Plan  ASA: 2  Anesthesia Plan: General   Post-op Pain Management: Regional block*   Induction: Intravenous  PONV Risk Score and Plan: 3 and Ondansetron  and Dexamethasone   Airway Management Planned: LMA  Additional Equipment:   Intra-op Plan:   Post-operative Plan: Extubation in OR  Informed Consent: I have reviewed the patients History and Physical, chart, labs and discussed the procedure including the risks, benefits and alternatives for the proposed anesthesia with the patient or authorized representative who has indicated his/her understanding and acceptance.       Plan Discussed with: CRNA  Anesthesia Plan Comments:         Anesthesia Quick Evaluation  "

## 2024-03-02 NOTE — Anesthesia Procedure Notes (Signed)
 Procedure Name: LMA Insertion Date/Time: 03/02/2024 2:31 PM  Performed by: Dasie Nena PARAS, CRNAPre-anesthesia Checklist: Patient identified, Emergency Drugs available, Suction available, Patient being monitored and Timeout performed Patient Re-evaluated:Patient Re-evaluated prior to induction Oxygen Delivery Method: Circle system utilized Preoxygenation: Pre-oxygenation with 100% oxygen Induction Type: IV induction Ventilation: Mask ventilation without difficulty LMA: LMA inserted LMA Size: 4.0 Number of attempts: 1 Placement Confirmation: positive ETCO2 and breath sounds checked- equal and bilateral Tube secured with: Tape Dental Injury: Teeth and Oropharynx as per pre-operative assessment

## 2024-03-02 NOTE — Transfer of Care (Signed)
 Immediate Anesthesia Transfer of Care Note  Patient: Nicole Charles  Procedure(s) Performed: ARTHROTOMY, KNEE (Right: Knee)  Patient Location: PACU  Anesthesia Type:GA combined with regional for post-op pain  Level of Consciousness: awake, drowsy, patient cooperative, and responds to stimulation  Airway & Oxygen Therapy: Patient Spontanous Breathing and Patient connected to face mask oxygen  Post-op Assessment: Report given to RN and Post -op Vital signs reviewed and stable  Post vital signs: Reviewed and stable  Last Vitals:  Vitals Value Taken Time  BP 152/95 03/02/24 15:42  Temp    Pulse 71 03/02/24 15:44  Resp 15 03/02/24 15:44  SpO2 100 % 03/02/24 15:44  Vitals shown include unfiled device data.  Last Pain:  Vitals:   03/02/24 1320  TempSrc:   PainSc: 0-No pain         Complications: No notable events documented.

## 2024-03-02 NOTE — Anesthesia Postprocedure Evaluation (Signed)
"   Anesthesia Post Note  Patient: Nicole Charles  Procedure(s) Performed: ARTHROTOMY, KNEE (Right: Knee)     Patient location during evaluation: PACU Anesthesia Type: General Level of consciousness: awake and alert Pain management: pain level controlled Vital Signs Assessment: post-procedure vital signs reviewed and stable Respiratory status: spontaneous breathing, nonlabored ventilation, respiratory function stable and patient connected to nasal cannula oxygen Cardiovascular status: blood pressure returned to baseline and stable Postop Assessment: no apparent nausea or vomiting Anesthetic complications: no   No notable events documented.  Last Vitals:  Vitals:   03/02/24 1615 03/02/24 1630  BP: (!) 144/103   Pulse: (!) 56 60  Resp: 12 15  Temp:    SpO2: 96% 92%    Last Pain:  Vitals:   03/02/24 1615  TempSrc:   PainSc: 5                  Lynwood MARLA Cornea      "

## 2024-03-02 NOTE — Interval H&P Note (Signed)
 History and Physical Interval Note:  03/02/2024 12:26 PM  Nicole Charles  has presented today for surgery, with the diagnosis of Right knee arthrofibrosis.  The various methods of treatment have been discussed with the patient and family. After consideration of risks, benefits and other options for treatment, the patient has consented to  Procedures with comments: ARTHROTOMY, KNEE (Right) - Rightknee arthrotomy and scar excision as a surgical intervention.  The patient's history has been reviewed, patient examined, no change in status, stable for surgery.  I have reviewed the patient's chart and labs.  Questions were answered to the patient's satisfaction.     Dempsey Kohan Azizi

## 2024-03-02 NOTE — Op Note (Signed)
 NAMEAMORITA, VANROSSUM MEDICAL RECORD NO: [id number] ACCOUNT NO: [id number] DATE OF BIRTH: 07/04/1975 FACILITY: [facility] LOCATION: [facility] PHYSICIAN: Dempsey GAILS. Kaylia Winborne, MD  Operative Report   DATE OF PROCEDURE: 03/02/2024  PREOPERATIVE DIAGNOSIS:  Arthrofibrosis, right knee.  POSTOPERATIVE DIAGNOSIS:  Arthrofibrosis, right knee.  PROCEDURE:  Right knee arthrotomy and scar excision.  SURGEON:  Dempsey GAILS. Joy Haegele, MD  ASSISTANT:  Waddell Sor, PA-C  ANESTHESIA:  General and adductor canal block.  ESTIMATED BLOOD LOSS:  Minimal.  DRAINS:  None.  TOURNIQUET TIME:  21 minutes at 300 mmHg.  COMPLICATIONS:  None.  CONDITION:  Stable to recovery.  BRIEF CLINICAL NOTE:  The patient is a 49 year old female, had a complex history in regards to her right knee.  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  She did not improve with an attempted closed  manipulation.  She has been in extensive physical therapy.  Flexion is limited to about 70 degrees.  She presents now for arthrotomy and scar excision.  DESCRIPTION OF PROCEDURE:  After successful administration of adductor canal block then general anesthetic, a tourniquet was placed on her right thigh and right lower extremity prepped and draped in the usual sterile fashion.  Exam under anesthesia shows  a range of about 15 to 70 degrees.  A tourniquet was then placed on her right thigh and right lower extremity prepped and draped in the usual sterile fashion.  Extremity is wrapped in Esmarch, tourniquet inflated to 300 mmHg.  Midline incision is made  with a 10 blade.  There is even scarring in the subcutaneous tissue and that scar is released creating medial and lateral flaps.  The fresh knife blade is used to make a medial parapatellar arthrotomy.  Did not encounter any fluid in the joint.  There is  a tremendous amount of scar tissue in the medial and lateral gutters, superior joint as well as the  inferolateral area in the fat pad.  I began excising this scar with cautery back to normal appearing tissue.  We recreated the medial and lateral gutters  as well as infrapatellar area.  When I felt as though all the scar was removed, then we flexed the knee and was able to get it to about 110 degrees.  The patella tracked normally.  We again looked back to see if there is any more abnormal tissue and  anything abnormal is removed.  The wound is then copiously irrigated with saline solution.  The arthrotomy is closed with a running #1 PDS suture.  Flexion against gravity is about 105 degrees.  The patella tracks normally.  Tourniquet is released, total  time of 21 minutes.  There is minimal bleeding in the knee in the subcutaneous tissues.  Cautery is used to coagulate this.  Further irrigation is performed in the subcutaneous.  The subcutaneous is closed with interrupted 2-0 Vicryl.  Flexion against  gravity remains about 105 degrees.  Subcuticular is closed with a running 4-0 Monocryl.  Incision cleaned and dried and Steri-Strips and sterile dressing applied.  She is awakened and transported to recovery in stable condition.   VA[facility] D: 03/02/2024 3:25:26 pm T: 03/02/2024 7:38:00 pm  JOB: [id number]/ [id number]

## 2024-03-03 ENCOUNTER — Encounter (HOSPITAL_COMMUNITY): Payer: Self-pay | Admitting: Orthopedic Surgery

## 2024-03-04 MED ORDER — ROPIVACAINE HCL 5 MG/ML IJ SOLN
INTRAMUSCULAR | Status: DC | PRN
  Administered 2024-03-02: 20 mL via PERINEURAL

## 2024-03-04 NOTE — Addendum Note (Signed)
 Addendum  created 03/04/24 0815 by Keneth Lynwood POUR, MD   Child order released for a procedure order, Clinical Note Signed, Intraprocedure Blocks edited, Intraprocedure Meds edited, SmartForm saved

## 2024-03-04 NOTE — Anesthesia Procedure Notes (Addendum)
 Anesthesia Regional Block: Adductor canal block   Pre-Anesthetic Checklist: , timeout performed,  Correct Patient, Correct Site, Correct Laterality,  Correct Procedure, Correct Position, site marked,  Risks and benefits discussed,  Surgical consent,  Pre-op evaluation,  At surgeon's request and post-op pain management  Laterality: Right  Prep: Maximum Sterile Barrier Precautions used, chloraprep       Needles:  Injection technique: Single-shot  Needle Type: Echogenic Needle      Needle Gauge: 20     Additional Needles:   Procedures:,,,, ultrasound used (permanent image in chart),,    Narrative:  Start time: 03/02/2024 1:45 PM End time: 03/04/2024 1:50 PM Injection made incrementally with aspirations every 5 mL.  Performed by: Personally  Anesthesiologist: Keneth Lynwood POUR, MD
